# Patient Record
Sex: Female | Born: 1975 | Hispanic: No | Marital: Single | Smoking: Never smoker
Health system: Southern US, Community
[De-identification: ages and names within clinical notes are randomized; demographics above are authoritative.]

## PROBLEM LIST (undated history)

## (undated) DIAGNOSIS — F419 Anxiety disorder, unspecified: Secondary | ICD-10-CM

## (undated) DIAGNOSIS — K219 Gastro-esophageal reflux disease without esophagitis: Secondary | ICD-10-CM

## (undated) HISTORY — DX: Anxiety disorder, unspecified: F41.9

## (undated) HISTORY — DX: Gastro-esophageal reflux disease without esophagitis: K21.9

---

## 2003-03-05 ENCOUNTER — Other Ambulatory Visit: Admission: RE | Admit: 2003-03-05 | Discharge: 2003-03-05 | Payer: Self-pay | Admitting: Obstetrics and Gynecology

## 2004-05-07 ENCOUNTER — Other Ambulatory Visit: Admission: RE | Admit: 2004-05-07 | Discharge: 2004-05-07 | Payer: Self-pay | Admitting: Obstetrics and Gynecology

## 2005-06-24 ENCOUNTER — Other Ambulatory Visit: Admission: RE | Admit: 2005-06-24 | Discharge: 2005-06-24 | Payer: Self-pay | Admitting: Obstetrics and Gynecology

## 2013-03-21 ENCOUNTER — Encounter: Payer: Self-pay | Admitting: General Practice

## 2013-03-21 ENCOUNTER — Ambulatory Visit (INDEPENDENT_AMBULATORY_CARE_PROVIDER_SITE_OTHER): Payer: No Typology Code available for payment source | Admitting: General Practice

## 2013-03-21 ENCOUNTER — Ambulatory Visit (INDEPENDENT_AMBULATORY_CARE_PROVIDER_SITE_OTHER): Payer: No Typology Code available for payment source

## 2013-03-21 VITALS — BP 126/81 | HR 73 | Temp 97.8°F | Wt 190.5 lb

## 2013-03-21 DIAGNOSIS — M545 Low back pain: Secondary | ICD-10-CM

## 2013-03-21 DIAGNOSIS — M5430 Sciatica, unspecified side: Secondary | ICD-10-CM

## 2013-03-21 DIAGNOSIS — N926 Irregular menstruation, unspecified: Secondary | ICD-10-CM

## 2013-03-21 LAB — POCT URINALYSIS DIPSTICK
Bilirubin, UA: NEGATIVE
Blood, UA: NEGATIVE
Glucose, UA: NEGATIVE
Ketones, UA: NEGATIVE
Spec Grav, UA: 1.01
Urobilinogen, UA: NEGATIVE

## 2013-03-21 LAB — POCT UA - MICROSCOPIC ONLY: Mucus, UA: NEGATIVE

## 2013-03-21 LAB — POCT URINE PREGNANCY: Preg Test, Ur: NEGATIVE

## 2013-03-21 MED ORDER — IBUPROFEN 600 MG PO TABS: 600.0000 mg | ORAL_TABLET | Freq: Three times a day (TID) | ORAL | Status: DC | PRN

## 2013-03-21 MED ORDER — CYCLOBENZAPRINE HCL 5 MG PO TABS: 5.0000 mg | ORAL_TABLET | Freq: Two times a day (BID) | ORAL | Status: DC | PRN

## 2013-03-21 MED ORDER — METHYLPREDNISOLONE ACETATE 80 MG/ML IJ SUSP
80.0000 mg | Freq: Once | INTRAMUSCULAR | Status: AC
  Administered 2013-03-21: 80 mg via INTRAMUSCULAR

## 2013-03-21 NOTE — Progress Notes (Signed)
Subjective:    Patient ID: Diana Lopez, female    DOB: 07-Jun-1976, 37 y.o.   MRN: [id number]  Back Pain This is a new problem. The current episode started more than 1 month ago. The problem occurs 2 to 4 times per day. The problem has been gradually worsening since onset. The pain is present in the lumbar spine. The quality of the pain is described as aching. Radiates to: reports tingling in hands and thighs at times. The pain is at a severity of 7/10. The symptoms are aggravated by sitting. Associated symptoms include dysuria, numbness and tingling. Pertinent negatives include no abdominal pain, bladder incontinence, bowel incontinence, chest pain, fever, headaches, pelvic pain or weakness. She has tried NSAIDs for the symptoms.  Patient reports last menstrual cycle as about 1 month ago and her cycles are irregular. 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 Also reported moving items around in an apartment a few weeks ago.     Review of Systems  Constitutional: Negative for fever and chills.  HENT: Negative for neck pain and neck stiffness.   Respiratory: Negative for chest tightness, shortness of breath and wheezing.   Cardiovascular: Negative for chest pain and palpitations.  Gastrointestinal: Negative for abdominal pain and bowel incontinence.  Genitourinary: Positive for dysuria, urgency and frequency. Negative for bladder incontinence, hematuria, difficulty urinating and pelvic pain.  Musculoskeletal: Positive for back pain.  Neurological: Positive for tingling and numbness. Negative for dizziness, weakness and headaches.       Objective:   Physical Exam  Constitutional: She is oriented to person, place, and time. She appears well-developed and well-nourished.  Cardiovascular: Normal rate, regular rhythm and normal heart sounds.   Pulmonary/Chest: Effort normal and breath sounds normal. No respiratory distress. She exhibits no tenderness.  Abdominal: Soft. Bowel sounds are normal. She  exhibits no distension. There is no tenderness.  Musculoskeletal: She exhibits tenderness.  Tenderness noted to lumbar area with palpation, negative edema  Neurological: She is alert and oriented to person, place, and time.  Skin: Skin is warm and dry.  Psychiatric: She has a normal mood and affect.   [facility] reading (PRIMARY) by Coralie Keens, FNP-C, no fracture or dislocation noted.    Results for orders placed in visit on 03/21/13  POCT UA - MICROSCOPIC ONLY      Result Value Range   WBC, Ur, HPF, POC occ     RBC, urine, microscopic rare     Bacteria, U Microscopic moderate     Mucus, UA negative     Epithelial cells, urine per micros occ     Crystals, Ur, HPF, POC negative     Casts, Ur, LPF, POC negative     Yeast, UA negative    POCT URINALYSIS DIPSTICK      Result Value Range   Color, UA gold     Clarity, UA cloudy     Glucose, UA negative     Bilirubin, UA negative     Ketones, UA negative     Spec Grav, UA 1.010     Blood, UA negative     pH, UA 7.5     Protein, UA negative     Urobilinogen, UA negative     Nitrite, UA negative     Leukocytes, UA Negative    POCT URINE PREGNANCY      Result Value Range   Preg Test, Ur Negative  Assessment & Plan:  1. Low back pain - POCT UA - Microscopic Only - POCT urinalysis dipstick - POCT urine pregnancy - DG Lumbar Spine 2-3 Views; Future  2. Sciatica, unspecified laterality - methylPREDNISolone acetate (DEPO-MEDROL) injection 80 mg; Inject 1 mL (80 mg total) into the muscle once. - cyclobenzaprine (FLEXERIL) 5 MG tablet; Take 1 tablet (5 mg total) by mouth 2 (two) times daily as needed for muscle spasms.  Dispense: 30 tablet; Refill: 0 - ibuprofen (ADVIL,MOTRIN) 600 MG tablet; Take 1 tablet (600 mg total) by mouth every 8 (eight) hours as needed for pain.  Dispense: 30 tablet; Refill: 0 -warm compresses to affected area for 10-15 minutes, three times daily -RTO if  symptoms worsen or seek emergency medical treatment -will refer if no improvement -Patient verbalized understanding -Coralie Keens, FNP-C

## 2013-03-21 NOTE — Patient Instructions (Addendum)
Back Pain, Adult  Low back pain is very common. About 1 in 5 people have back pain. The cause of low back pain is rarely dangerous. The pain often gets better over time. About half of people with a sudden onset of back pain feel better in just 2 weeks. About 8 in 10 people feel better by 6 weeks.   CAUSES  Some common causes of back pain include:  · Strain of the muscles or ligaments supporting the spine.  · Wear and tear (degeneration) of the spinal discs.  · Arthritis.  · Direct injury to the back.  DIAGNOSIS  Most of the time, the direct cause of low back pain is not known. However, back pain can be treated effectively even when the exact cause of the pain is unknown. Answering your caregiver's questions about your overall health and symptoms is one of the most accurate ways to make sure the cause of your pain is not dangerous. If your caregiver needs more information, he or she may order lab work or imaging tests (X-rays or MRIs). However, even if imaging tests show changes in your back, this usually does not require surgery.  HOME CARE INSTRUCTIONS  For many people, back pain returns. Since low back pain is rarely dangerous, it is often a condition that people can learn to manage on their own.   · Remain active. It is stressful on the back to sit or stand in one place. Do not sit, drive, or stand in one place for more than 30 minutes at a time. Take short walks on level surfaces as soon as pain allows. Try to increase the length of time you walk each day.  · Do not stay in bed. Resting more than 1 or 2 days can delay your recovery.  · Do not avoid exercise or work. Your body is made to move. It is not dangerous to be active, even though your back may hurt. Your back will likely heal faster if you return to being active before your pain is gone.  · Pay attention to your body when you  bend and lift. Many people have less discomfort when lifting if they bend their knees, keep the load close to their bodies, and  avoid twisting. Often, the most comfortable positions are those that put less stress on your recovering back.  · Find a comfortable position to sleep. Use a firm mattress and lie on your side with your knees slightly bent. If you lie on your back, put a pillow under your knees.  · Only take over-the-counter or prescription medicines as directed by your caregiver. Over-the-counter medicines to reduce pain and inflammation are often the most helpful. Your caregiver may prescribe muscle relaxant drugs. These medicines help dull your pain so you can more quickly return to your normal activities and healthy exercise.  · Put ice on the injured area.  · Put ice in a plastic bag.  · Place a towel between your skin and the bag.  · Leave the ice on for 15-20 minutes, 3-4 times a day for the first 2 to 3 days. After that, ice and heat may be alternated to reduce pain and spasms.  · Ask your caregiver about trying back exercises and gentle massage. This may be of some benefit.  · Avoid feeling anxious or stressed. Stress increases muscle tension and can worsen back pain. It is important to recognize when you are anxious or stressed and learn ways to manage it. Exercise is a great option.  SEEK MEDICAL CARE IF:  · You have pain that is not relieved with rest or   medicine.  · You have pain that does not improve in 1 week.  · You have new symptoms.  · You are generally not feeling well.  SEEK IMMEDIATE MEDICAL CARE IF:   · You have pain that radiates from your back into your legs.  · You develop new bowel or bladder control problems.  · You have unusual weakness or numbness in your arms or legs.  · You develop nausea or vomiting.  · You develop abdominal pain.  · You feel faint.  Document Released: 08/10/2005 Document Revised: 02/09/2012 Document Reviewed: 12/29/2010  [employer]® Patient Information ©2014 [employer].

## 2013-03-30 ENCOUNTER — Encounter: Payer: Self-pay | Admitting: General Practice

## 2013-03-30 ENCOUNTER — Ambulatory Visit (INDEPENDENT_AMBULATORY_CARE_PROVIDER_SITE_OTHER): Payer: No Typology Code available for payment source | Admitting: General Practice

## 2013-03-30 DIAGNOSIS — M5430 Sciatica, unspecified side: Secondary | ICD-10-CM

## 2013-03-30 NOTE — Progress Notes (Signed)
  Subjective:    Patient ID: Diana Lopez, female    DOB: 1975/09/05, 37 y.o.   MRN: [id number]  HPI Patient presents today for follow up of low back pain. She reports the pain in back has decreased, but radiating to right thigh. She reports depo-medrol made her [place] and doesn't want any type of steriod. She reports feeling like she is in a daze when taking flexeril. She reports pain is sharp and radiates from low back to right thigh, rate as 5 on 1-10 scale.      Review of Systems  Constitutional: Negative for chills and fatigue.  Respiratory: Negative for chest tightness and shortness of breath.   Cardiovascular: Negative for chest pain and palpitations.  Musculoskeletal: Positive for back pain.       Periodic low back pain and left thigh  Neurological: Negative for dizziness, weakness and headaches.       Objective:   Physical Exam  Constitutional: She is oriented to person, place, and time. She appears well-developed and well-nourished.  Cardiovascular: Normal rate, regular rhythm and normal heart sounds.   Pulmonary/Chest: Effort normal and breath sounds normal. No respiratory distress. She exhibits no tenderness.  Musculoskeletal: She exhibits no tenderness.  Neurological: She is alert and oriented to person, place, and time.  Skin: Skin is warm and dry.  Psychiatric: She has a normal mood and affect.          Assessment & Plan:  1. Low back pain with sciatica, right - Ambulatory referral to Orthopedic Surgery -continue ibuprofen and flexeril -rest affected area -may apply heat or ice as directed -RTO if symptoms worsen or unresolved -Patient verbalized understanding -Coralie Keens, FNP-C

## 2014-10-18 ENCOUNTER — Encounter: Payer: Self-pay | Admitting: Family Medicine

## 2014-10-18 ENCOUNTER — Ambulatory Visit (INDEPENDENT_AMBULATORY_CARE_PROVIDER_SITE_OTHER): Payer: No Typology Code available for payment source

## 2014-10-18 ENCOUNTER — Ambulatory Visit (INDEPENDENT_AMBULATORY_CARE_PROVIDER_SITE_OTHER): Payer: No Typology Code available for payment source | Admitting: Family Medicine

## 2014-10-18 ENCOUNTER — Telehealth: Payer: Self-pay | Admitting: Nurse Practitioner

## 2014-10-18 VITALS — BP 122/70 | HR 69 | Temp 97.4°F | Ht 64.5 in | Wt 195.0 lb

## 2014-10-18 DIAGNOSIS — M25511 Pain in right shoulder: Secondary | ICD-10-CM | POA: Diagnosis not present

## 2014-10-18 DIAGNOSIS — R202 Paresthesia of skin: Secondary | ICD-10-CM | POA: Diagnosis not present

## 2014-10-18 DIAGNOSIS — R200 Anesthesia of skin: Secondary | ICD-10-CM

## 2014-10-18 DIAGNOSIS — G629 Polyneuropathy, unspecified: Secondary | ICD-10-CM

## 2014-10-18 MED ORDER — MELOXICAM 15 MG PO TABS: 15.0000 mg | ORAL_TABLET | Freq: Every day | ORAL | Status: DC

## 2014-10-18 NOTE — Telephone Encounter (Signed)
Appt given for today 

## 2014-10-18 NOTE — Patient Instructions (Signed)
Use warm wet compresses to the top of the shoulder and the back of the neck for 20 minutes 3 or 4 times daily Take anti-inflammatory medicine as directed and discontinue this if it bothers her stomach

## 2014-10-18 NOTE — Progress Notes (Signed)
Subjective:    Patient ID: Diana Lopez, female    DOB: Sep 06, 1975, 39 y.o.   MRN: [id number]  HPI Patient here today for neck, right shoulder and arm pain that also causes some right hand numbness.         There are no active problems to display for this patient.  Outpatient Encounter Prescriptions as of 10/18/2014  Medication Sig  . [DISCONTINUED] cyclobenzaprine (FLEXERIL) 5 MG tablet Take 1 tablet (5 mg total) by mouth 2 (two) times daily as needed for muscle spasms.  . [DISCONTINUED] ibuprofen (ADVIL,MOTRIN) 600 MG tablet Take 1 tablet (600 mg total) by mouth every 8 (eight) hours as needed for pain.    Review of Systems  Constitutional: Negative.   HENT: Negative.   Eyes: Negative.   Respiratory: Negative.   Cardiovascular: Negative.   Gastrointestinal: Negative.   Endocrine: Negative.   Genitourinary: Negative.   Musculoskeletal: Positive for arthralgias (right shoulder and arm) and neck pain.  Skin: Negative.   Allergic/Immunologic: Negative.   Neurological: Positive for numbness (of right hand at times).  Hematological: Negative.   Psychiatric/Behavioral: Negative.        Objective:   Physical Exam  Constitutional: She is oriented to person, place, and time. She appears well-developed and well-nourished. No distress.  HENT:  Head: Normocephalic and atraumatic.  Mouth/Throat: Oropharynx is clear and moist.  Eyes: Conjunctivae and EOM are normal. Pupils are equal, round, and reactive to light. Right eye exhibits no discharge. Left eye exhibits no discharge. No scleral icterus.  Neck: Normal range of motion. Neck supple. No thyromegaly present.  Cardiovascular: Normal rate, regular rhythm and normal heart sounds.   No murmur heard. Pulmonary/Chest: Effort normal and breath sounds normal. No respiratory distress. She has no wheezes. She has no rales. She exhibits no tenderness.  Abdominal: She exhibits no mass.  Musculoskeletal: Normal range of motion. She  exhibits tenderness.  Tenderness right before meals joint area and superior shoulder  Lymphadenopathy:    She has no cervical adenopathy.  Neurological: She is alert and oriented to person, place, and time. She has normal reflexes. No cranial nerve deficit.  Skin: Skin is warm and dry. No rash noted.  Psychiatric: She has a normal mood and affect. Her behavior is normal. Judgment and thought content normal.  Nursing note and vitals reviewed.  BP 122/70 mmHg  Pulse 69  Temp(Src) 97.4 F (36.3 C) (Oral)  Ht 5' 4.5" (1.638 m)  Wt 195 lb (88.451 kg)  BMI 32.97 kg/m2  LMP 10/11/2014  [facility] reading (PRIMARY) by  Dr. Parke SimmersMoore-C-spine--no obvious disease or injury                                      Assessment & Plan:  1. Right shoulder pain -Use warm wet compresses 20 minutes 3 or 4 times daily to posterior neck and superior right shoulder - DG Cervical Spine Complete; Future - meloxicam (MOBIC) 15 MG tablet; Take 1 tablet (15 mg total) by mouth daily.  Dispense: 30 tablet; Refill: 0  2. Numbness and tingling in right hand -Take anti-inflammatory medicines and use moist heat - DG Cervical Spine Complete; Future  3. Neuropathy -Take anti-inflammatory medicines and return to clinic for recheck in 3 weeks if neuropathy continues we may very well need to do an MRI.  Patient Instructions  Use warm wet compresses to the top of the shoulder and the  back of the neck for 20 minutes 3 or 4 times daily Take anti-inflammatory medicine as directed and discontinue this if it bothers her stomach   Nyra Capes MD

## 2014-10-19 ENCOUNTER — Other Ambulatory Visit: Payer: Self-pay | Admitting: Family Medicine

## 2014-10-19 DIAGNOSIS — G629 Polyneuropathy, unspecified: Secondary | ICD-10-CM

## 2014-10-31 ENCOUNTER — Ambulatory Visit (HOSPITAL_COMMUNITY)
Admission: RE | Admit: 2014-10-31 | Discharge: 2014-10-31 | Disposition: A | Discharge status: Home / Self Care | Payer: PRIVATE HEALTH INSURANCE | Source: Ambulatory Visit | Attending: Family Medicine | Admitting: Family Medicine

## 2014-10-31 DIAGNOSIS — R200 Anesthesia of skin: Secondary | ICD-10-CM | POA: Insufficient documentation

## 2014-10-31 DIAGNOSIS — R531 Weakness: Secondary | ICD-10-CM | POA: Insufficient documentation

## 2014-10-31 DIAGNOSIS — M5022 Other cervical disc displacement, mid-cervical region: Secondary | ICD-10-CM | POA: Insufficient documentation

## 2014-10-31 DIAGNOSIS — G629 Polyneuropathy, unspecified: Secondary | ICD-10-CM

## 2014-10-31 DIAGNOSIS — M542 Cervicalgia: Secondary | ICD-10-CM | POA: Diagnosis present

## 2014-11-06 ENCOUNTER — Ambulatory Visit (INDEPENDENT_AMBULATORY_CARE_PROVIDER_SITE_OTHER): Payer: No Typology Code available for payment source | Admitting: Family Medicine

## 2014-11-06 ENCOUNTER — Encounter: Payer: Self-pay | Admitting: Family Medicine

## 2014-11-06 ENCOUNTER — Ambulatory Visit (INDEPENDENT_AMBULATORY_CARE_PROVIDER_SITE_OTHER): Payer: No Typology Code available for payment source

## 2014-11-06 VITALS — BP 132/89 | HR 73 | Temp 97.8°F | Ht 64.0 in | Wt 195.0 lb

## 2014-11-06 DIAGNOSIS — M25511 Pain in right shoulder: Secondary | ICD-10-CM | POA: Diagnosis not present

## 2014-11-06 DIAGNOSIS — M5020 Other cervical disc displacement, unspecified cervical region: Secondary | ICD-10-CM

## 2014-11-06 DIAGNOSIS — M549 Dorsalgia, unspecified: Secondary | ICD-10-CM

## 2014-11-06 DIAGNOSIS — G629 Polyneuropathy, unspecified: Secondary | ICD-10-CM | POA: Diagnosis not present

## 2014-11-06 DIAGNOSIS — M542 Cervicalgia: Secondary | ICD-10-CM

## 2014-11-06 DIAGNOSIS — M5030 Other cervical disc degeneration, unspecified cervical region: Secondary | ICD-10-CM

## 2014-11-06 NOTE — Progress Notes (Signed)
Subjective:    Patient ID: Diana Lopez, female    DOB: November 10, 1975, 39 y.o.   MRN: [id number]  HPI Patient here today for 2 week follow up on right shoulder pain and hand numbness. The plain films at the time of the first visit showed bony encroachment on the right from C2 and 3 through C6. As a result of these initial findings and because the patient was in so much pain and we did arrange for her to get an MRI. The MRI showed a bulging disc at C5 and 6 more prominent to the right and mild encroachment upon the foramen on the right. This would according to the radiologist have some potential for affecting right C VI nerve root. The patient today says her pain is more down and really upper thoracic spine toward the right shoulder blade. We informed her that we did not x-ray the thoracic spine at the right visit we only did the cervical spine. The patient did not tolerate the anti-inflammatory medicine as it causes nausea.        There are no active problems to display for this patient.  Outpatient Encounter Prescriptions as of 11/06/2014  Medication Sig  . [DISCONTINUED] meloxicam (MOBIC) 15 MG tablet Take 1 tablet (15 mg total) by mouth daily.    Review of Systems  Constitutional: Negative.   HENT: Negative.   Eyes: Negative.   Respiratory: Negative.   Cardiovascular: Negative.   Gastrointestinal: Negative.   Endocrine: Negative.   Genitourinary: Negative.   Musculoskeletal: Positive for arthralgias (right shoulder pain).  Skin: Negative.   Allergic/Immunologic: Negative.   Neurological: Negative.   Hematological: Negative.   Psychiatric/Behavioral: Negative.        Objective:   Physical Exam  Constitutional: She is oriented to person, place, and time. She appears well-developed and well-nourished.  HENT:  Head: Normocephalic.  Neck: Normal range of motion.  Musculoskeletal: Normal range of motion. She exhibits tenderness. She exhibits no edema.  The patient continues  to have right shoulder and right parathoracic spine pain.  Neurological: She is alert and oriented to person, place, and time.  Skin: Skin is warm and dry. No rash noted.  Psychiatric: She has a normal mood and affect. Her behavior is normal. Judgment and thought content normal.  Nursing note and vitals reviewed.  BP 132/89 mmHg  Pulse 73  Temp(Src) 97.8 F (36.6 C) (Oral)  Ht 5\' 4"  (1.626 m)  Wt 195 lb (88.451 kg)  BMI 33.46 kg/m2  LMP 10/11/2014  A copy of the plain films and MRI films were given to the patient and the anatomy was explained to her so that she understands with the neuropathy was coming from. She however, today describes pain being lower and she described before. We will get additional thoracic spine films.  [facility] reading (PRIMARY) by  Dr. Loveta Dellis-thoracic spine--                                      Assessment & Plan:  1. Mid back pain - DG Thoracic Spine 2 View; Future - Ambulatory referral to Neurosurgery  2. Bulge of cervical disc with myelopathy - Ambulatory referral to Neurosurgery  3. Neck pain - Ambulatory referral to Neurosurgery  4. Right shoulder pain - Ambulatory referral to Neurosurgery  Patient Instructions  Use warm wet compresses to the neck and posterior shoulder and avoid heavy lifting We will arrange  for you to have an appointment with neurosurgery to further evaluate abnormal findings on the MRI of the C-spine   Nyra Capes MD

## 2014-11-06 NOTE — Patient Instructions (Signed)
Use warm wet compresses to the neck and posterior shoulder and avoid heavy lifting We will arrange for you to have an appointment with neurosurgery to further evaluate abnormal findings on the MRI of the C-spine

## 2014-12-10 ENCOUNTER — Telehealth: Payer: Self-pay | Admitting: Family Medicine

## 2014-12-11 NOTE — Telephone Encounter (Signed)
LMOM as per DPR of 09/2014 that x-rays up front ready for pickup

## 2015-08-25 HISTORY — PX: CARPAL TUNNEL RELEASE: SHX101

## 2015-10-01 ENCOUNTER — Ambulatory Visit (INDEPENDENT_AMBULATORY_CARE_PROVIDER_SITE_OTHER): Payer: No Typology Code available for payment source | Admitting: Family Medicine

## 2015-10-01 ENCOUNTER — Encounter: Payer: Self-pay | Admitting: Family Medicine

## 2015-10-01 VITALS — BP 134/85 | HR 82 | Temp 97.5°F | Ht 64.0 in | Wt 206.2 lb

## 2015-10-01 DIAGNOSIS — R202 Paresthesia of skin: Secondary | ICD-10-CM

## 2015-10-01 DIAGNOSIS — Z131 Encounter for screening for diabetes mellitus: Secondary | ICD-10-CM

## 2015-10-01 DIAGNOSIS — Z13220 Encounter for screening for lipoid disorders: Secondary | ICD-10-CM

## 2015-10-01 MED ORDER — PREDNISONE 20 MG PO TABS: ORAL_TABLET | ORAL | Status: DC

## 2015-10-01 NOTE — Progress Notes (Signed)
BP 134/85 mmHg  Pulse 82  Temp(Src) 97.5 F (36.4 C) (Oral)  Ht 5' 4"  (1.626 m)  Wt 206 lb 3.2 oz (93.532 kg)  BMI 35.38 kg/m2   Subjective:    Patient ID: Diana Lopez, female    DOB: October 07, 1975, 40 y.o.   MRN: [id number]  HPI: Diana Lopez is a 40 y.o. female presenting on 10/01/2015 for Bilateral hand pain, numbness, tingling & swelling   HPI Workmen's Comp. bilateral hand pain Patient is coming in for [employer]she works for [employer]. She says she has been having increasingly worsening tingling and numbness in both hands shooting up into her arms and neck over the past 2 years. She says the pain is gotten so bad that she is having trouble holding things and her grip strength is a lot worse in her left hand. 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 She also had an MRI recently which showed she had possible nerve impingement on the right side of her neck. The pain on both sides tense tissue all the way up into her arm and her shoulder and into her neck when she has it. She describes tingling and numbness throughout all 5 digits of the hand and not more on one side than the other.  Relevant past medical, surgical, family and social history reviewed and updated as indicated. Interim medical history since our last visit reviewed. Allergies and medications reviewed and updated.  Review of Systems  Constitutional: Negative for fever and chills.  HENT: Negative for congestion, ear discharge and ear pain.   Eyes: Negative for redness and visual disturbance.  Respiratory: Negative for chest tightness and shortness of breath.   Cardiovascular: Negative for chest pain and leg swelling.  Genitourinary: Negative for dysuria and difficulty urinating.  Musculoskeletal: Negative for back pain and gait problem.  Skin: Negative for rash.  Neurological: Positive for weakness and numbness. Negative for dizziness,  tremors, speech difficulty, light-headedness and headaches.  Psychiatric/Behavioral: Negative for behavioral problems and agitation.  All other systems reviewed and are negative.   Per HPI unless specifically indicated above     Medication List       This list is accurate as of: 10/01/15  4:28 PM.  Always use your most recent med list.               predniSONE 20 MG tablet  Commonly known as:  DELTASONE  2 po at same time daily for 5 days           Objective:    BP 134/85 mmHg  Pulse 82  Temp(Src) 97.5 F (36.4 C) (Oral)  Ht 5' 4"  (1.626 m)  Wt 206 lb 3.2 oz (93.532 kg)  BMI 35.38 kg/m2  Wt Readings from Last 3 Encounters:  10/01/15 206 lb 3.2 oz (93.532 kg)  11/06/14 195 lb (88.451 kg)  10/31/14 195 lb (88.451 kg)    Physical Exam  Constitutional: She is oriented to person, place, and time. She appears well-developed and well-nourished. No distress.  Eyes: Conjunctivae and EOM are normal. Pupils are equal, round, and reactive to light.  Neck: Neck supple. No thyromegaly present.  Cardiovascular: Normal rate, regular rhythm, normal heart sounds and intact distal pulses.   No murmur heard. Pulmonary/Chest: Effort normal and breath sounds normal. No respiratory distress. She has no wheezes.  Musculoskeletal: Normal range of motion. She exhibits no edema or tenderness.  Lymphadenopathy:    She has no cervical adenopathy.  Neurological: She is alert and oriented to person, place, and time. She has normal reflexes. She displays no tremor. No cranial nerve deficit or sensory deficit. She exhibits normal muscle tone. Coordination normal.  Patient describes numbness and weakness in all 5 fingers of both hands, no weakness noted on exam  Skin: Skin is warm and dry. No rash noted. She is not diaphoretic.  Psychiatric: She has a normal mood and affect. Her behavior is normal.  Nursing note and vitals reviewed.   Results for orders placed or performed in visit on 03/21/13    POCT UA - Microscopic Only  Result Value Ref Range   WBC, Ur, HPF, POC occ    RBC, urine, microscopic rare    Bacteria, U Microscopic moderate    Mucus, UA negative    Epithelial cells, urine per micros occ    Crystals, Ur, HPF, POC negative    Casts, Ur, LPF, POC negative    Yeast, UA negative   POCT urinalysis dipstick  Result Value Ref Range   Color, UA gold    Clarity, UA cloudy    Glucose, UA negative    Bilirubin, UA negative    Ketones, UA negative    Spec Grav, UA 1.010    Blood, UA negative    pH, UA 7.5    Protein, UA negative    Urobilinogen, UA negative    Nitrite, UA negative    Leukocytes, UA Negative   POCT urine pregnancy  Result Value Ref Range   Preg Test, Ur Negative       Assessment & Plan:   Problem List Items Addressed This Visit    None    Visit Diagnoses    Paresthesia of both hands    -  Primary    Patient says she is having tingling and numbness bilaterally, difficult to discern between neck lesion and carpal tunnel, will send for nerve testing    Relevant Medications    predniSONE (DELTASONE) 20 MG tablet    Other Relevant Orders    Ambulatory referral to Neurology    TSH    Screening for diabetes mellitus        Relevant Orders    CMP14+EGFR    Screening for lipid disorders        Relevant Orders    Lipid panel        Follow up plan: Return if symptoms worsen or fail to improve.  Counseling provided for all of the vaccine components Orders Placed This Encounter  Procedures  . Ambulatory referral to Neurology    Caryl Pina, MD [facility] 10/01/2015, 4:28 PM

## 2015-10-22 ENCOUNTER — Telehealth: Payer: Self-pay | Admitting: Family Medicine

## 2015-10-23 ENCOUNTER — Encounter: Payer: Self-pay | Admitting: Neurology

## 2015-10-23 ENCOUNTER — Ambulatory Visit (INDEPENDENT_AMBULATORY_CARE_PROVIDER_SITE_OTHER): Payer: PRIVATE HEALTH INSURANCE | Admitting: Neurology

## 2015-10-23 VITALS — BP 126/84 | HR 70 | Ht 64.0 in | Wt 201.0 lb

## 2015-10-23 DIAGNOSIS — G5603 Carpal tunnel syndrome, bilateral upper limbs: Secondary | ICD-10-CM

## 2015-10-23 DIAGNOSIS — R200 Anesthesia of skin: Secondary | ICD-10-CM

## 2015-10-23 DIAGNOSIS — R208 Other disturbances of skin sensation: Secondary | ICD-10-CM

## 2015-10-23 NOTE — Progress Notes (Signed)
Patient is here for NCS/EMG of the upper extremities.  See procedure note for details.

## 2015-10-23 NOTE — Procedures (Signed)
[facility]  [address] [place], [address]  [place], [place] [address] Tel: [phone] Fax:  [phone] Test Date:  10/23/2015  Patient: Diana Lopez DOB: 10/15/1975 Physician: Nita Sickle  Sex: Female Height:  Ref Phys: Arville Care, MD  ID#: [id number] Temp: 33.1C Technician:    Patient Complaints: This is a 40 year-old female referred for evaluation of bilateral arm pain and paresthesias.  NCV & EMG Findings: Extensive electrodiagnostic testing of the right upper extremity and additional studies of the left shows: 1. Bilateral median sensory responses show prolonged latency (R5.0, L4.1 ms) and the right amplitude is significantly reduced (R7.5 V).  Bilateral ulnar sensory responses are within normal limits.  2. Right median motor response shows markedly prolonged latency (R7.0 ms) with normal amplitude. 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 Bilateral ulnar motor responses are within normal limits. 3. Sparse chronic motor axon loss changes are isolated to the right abductor pollicis brevis muscle, without accompanied active denervation.  Impression: 1. Right median neuropathy at or distal to the wrist, consistent with the clinical diagnosis of carpal tunnel syndrome; severe in degree electrically. 2. Left median neuropathy at or distal to the wrist, consistent with the clinical diagnosis of carpal tunnel syndrome; mild in degree electrically. 3. Incidentally, there is a right Martin-Gruber anastomosis, a normal variant. 4. There is no evidence of a cervical radiculopathy affecting the upper extremities.   _____________________________ Nita Sickle, D.O.    Nerve Conduction Studies Anti Sensory Summary Table   Stim Site NR Peak (ms) Norm Peak (ms) P-T Amp (V) Norm P-T Amp  Left Median Anti  Sensory (2nd Digit)  Wrist    4.1 <3.4 33.2 >20  Right Median Anti Sensory (2nd Digit)  Wrist    5.0 <3.4 7.5 >20  Left Ulnar Anti Sensory (5th Digit)  Wrist    2.5 <3.1 62.5 >12  Right Ulnar Anti Sensory (5th Digit)  Wrist    2.4 <3.1 60.8 >12   Motor Summary Table   Stim Site NR Onset (ms) Norm Onset (ms) O-P Amp (mV) Norm O-P Amp Site1 Site2 Delta-0 (ms) Dist (cm) Vel (m/s) Norm Vel (m/s)  Left Median Motor (Abd Poll Brev)  Wrist    4.1 <3.9 13.0 >6 Elbow Wrist 4.6 26.0 57 >50  Elbow    8.7  12.9         Right Median Motor (Abd Poll Brev)  Wrist    7.0 <3.9 6.6 >6 Elbow Wrist 2.5 26.0 104 >50  Elbow    9.5  6.3  Ulnar-wrist crossover Elbow 5.0 0.0    Ulnar-wrist crossover    4.5  4.9         Left Ulnar Motor (Abd Dig Minimi)  Wrist    2.3 <3.1 10.7 >7 B Elbow Wrist 3.5 21.5 61 >50  B Elbow    5.8  10.1  A Elbow B Elbow 1.3 10.0 77 >50  A Elbow    7.1  10.1         Right Ulnar Motor (Abd Dig Minimi)  Wrist    2.1 <3.1 8.3 >7 B Elbow Wrist 3.4 22.0 65 >50  B Elbow    5.5  8.3  A Elbow B Elbow 1.6 10.0 63 >50  A Elbow    7.1  7.8  EMG   Side Muscle Ins Act Fibs Psw Fasc Number Recrt Dur Dur. Amp Amp. Poly Poly. Comment  Right 1stDorInt Nml Nml Nml Nml Nml Nml Nml Nml Nml Nml Nml Nml N/A  Right Abd Poll Brev Nml Nml Nml Nml 1- Rapid Few 1+ Few 1+ Nml Nml N/A  Right Ext Indicis Nml Nml Nml Nml Nml Nml Nml Nml Nml Nml Nml Nml N/A  Right PronatorTeres Nml Nml Nml Nml Nml Nml Nml Nml Nml Nml Nml Nml N/A  Right Biceps Nml Nml Nml Nml Nml Nml Nml Nml Nml Nml Nml Nml N/A  Right Triceps Nml Nml Nml Nml Nml Nml Nml Nml Nml Nml Nml Nml N/A  Right Deltoid Nml Nml Nml Nml Nml Nml Nml Nml Nml Nml Nml Nml N/A  Left 1stDorInt Nml Nml Nml Nml Nml Nml Nml Nml Nml Nml Nml Nml N/A  Left Abd Poll Brev Nml Nml Nml Nml Nml Nml Nml Nml Nml Nml Nml Nml N/A  Left PronatorTeres Nml Nml Nml Nml Nml Nml Nml Nml Nml Nml Nml Nml N/A  Left Biceps Nml Nml Nml Nml Nml Nml Nml Nml Nml Nml Nml Nml N/A    Left Triceps Nml Nml Nml Nml Nml Nml Nml Nml Nml Nml Nml Nml N/A  Left Deltoid Nml Nml Nml Nml Nml Nml Nml Nml Nml Nml Nml Nml N/A      Waveforms:

## 2015-10-29 ENCOUNTER — Telehealth: Payer: Self-pay | Admitting: Family Medicine

## 2015-10-29 DIAGNOSIS — G5603 Carpal tunnel syndrome, bilateral upper limbs: Secondary | ICD-10-CM

## 2015-10-29 NOTE — Telephone Encounter (Signed)
Can you look at nerve study test from 3/1 under procedure. Patient advised that I will send to provider for review.

## 2015-10-30 NOTE — Telephone Encounter (Signed)
Patient aware of results and referral was done for Orthopedic surgon.

## 2015-10-30 NOTE — Telephone Encounter (Signed)
The results from the nerve studies show that she has significant carpal tunnel syndrome in the right wrist and hand and mild carpal tunnel in the left wrist and hand. I do not remember if she has a referral to an orthopedic but if she does not we should go ahead and refer her. Diana CareJoshua Tripp Goins, MD [facility] 10/30/2015, 7:52 AM

## 2015-10-31 ENCOUNTER — Telehealth: Payer: Self-pay | Admitting: Family Medicine

## 2015-11-07 ENCOUNTER — Telehealth: Payer: Self-pay | Admitting: Family Medicine

## 2015-12-30 ENCOUNTER — Encounter (INDEPENDENT_AMBULATORY_CARE_PROVIDER_SITE_OTHER): Payer: Self-pay

## 2016-01-24 ENCOUNTER — Encounter: Payer: Self-pay | Admitting: Family

## 2016-01-24 ENCOUNTER — Ambulatory Visit (INDEPENDENT_AMBULATORY_CARE_PROVIDER_SITE_OTHER): Payer: No Typology Code available for payment source | Admitting: Family

## 2016-01-24 VITALS — BP 120/95 | HR 69 | Temp 97.1°F | Ht 64.0 in | Wt 214.0 lb

## 2016-01-24 DIAGNOSIS — L0100 Impetigo, unspecified: Secondary | ICD-10-CM | POA: Diagnosis not present

## 2016-01-24 DIAGNOSIS — J3489 Other specified disorders of nose and nasal sinuses: Secondary | ICD-10-CM | POA: Diagnosis not present

## 2016-01-24 MED ORDER — MUPIROCIN 2 % EX OINT: 1.0000 "application " | TOPICAL_OINTMENT | Freq: Two times a day (BID) | CUTANEOUS | Status: DC

## 2016-01-24 NOTE — Progress Notes (Signed)
   Subjective:    Patient ID: Diana Lopez, female    DOB: 06-03-1976, 40 y.o.   MRN: [id number]  Rash This is a recurrent problem. The current episode started yesterday. The problem is unchanged. The affected locations include the face. The rash is characterized by blistering and pain. She was exposed to nothing. Pertinent negatives include no congestion, cough, diarrhea, eye pain, fever, joint pain, shortness of breath, sore throat or vomiting. Past treatments include antibiotic cream. The treatment provided no relief.      Review of Systems  Constitutional: Negative for fever.  HENT: Negative for congestion and sore throat.   Eyes: Negative for pain.  Respiratory: Negative for cough and shortness of breath.   Gastrointestinal: Negative for vomiting and diarrhea.  Musculoskeletal: Negative for joint pain.  Skin: Positive for rash.  All other systems reviewed and are negative.      Objective:   Physical Exam  Constitutional: She is oriented to person, place, and time. She appears well-developed and well-nourished. No distress.  HENT:  Head: Normocephalic and atraumatic.  Right Ear: External ear normal.  Left Ear: External ear normal.  Nose: Mucosal edema present.  Mouth/Throat: Oropharynx is clear and moist.  Right nare with yellow crusted lesion with mild swelling  Cardiovascular: Normal rate, regular rhythm, normal heart sounds and intact distal pulses.   No murmur heard. Pulmonary/Chest: Effort normal and breath sounds normal. No respiratory distress. She has no wheezes.  Musculoskeletal: Normal range of motion. She exhibits no edema or tenderness.  Neurological: She is alert and oriented to person, place, and time.  Skin: Skin is warm and dry.  Psychiatric: She has a normal mood and affect. Her behavior is normal. Judgment and thought content normal.  Vitals reviewed.    BP 120/95 mmHg  Pulse 69  Temp(Src) 97.1 F (36.2 C) (Oral)  Ht 5\' 4"  (1.626 m)  Wt 214 lb  (97.07 kg)  BMI 36.72 kg/m2      Assessment & Plan:  1. Impetigo -Do not scratch -Good hand hygiene discussed -RTO prn - mupirocin ointment (BACTROBAN) 2 %; Place 1 application into the nose 2 (two) times daily.  Dispense: 22 g; Refill: 0  2. Infected lesion in nose - mupirocin ointment (BACTROBAN) 2 %; Place 1 application into the nose 2 (two) times daily.  Dispense: 22 g; Refill: 0 - Anaerobic and Aerobic Culture  Jannifer Rodneyhristy Kenyata Napier, FNP

## 2016-01-24 NOTE — Patient Instructions (Signed)
Impetigo, Adult  Impetigo is an infection of the skin. It commonly occurs in young children, but it can also occur in adults. The infection causes itchy blisters and sores that produce brownish-yellow fluid. As the fluid dries, it forms a thick, honey-colored crust. These skin changes usually occur on the face but can also affect other areas of the body. Impetigo usually goes away in 7-10 days with treatment.  CAUSES  Impetigo is caused by two types of bacteria. It may be caused by staphylococci or streptococci bacteria. These bacteria cause impetigo when they get under the surface of the skin. This often happens after some damage to the skin, such as damage from:  · Cuts, scrapes, or scratches.  · Insect bites, especially when you scratch the area of a bite.  · Chickenpox or other illnesses that cause open skin sores.  · Nail biting or chewing.  Impetigo is contagious and can spread easily from one person to another. This may occur through close skin contact or by sharing towels, clothing, or other items with a person who has the infection.  RISK FACTORS  Some things that can increase the risk of getting this infection include:  · Playing sports that include skin-to-skin contact with others.  · Having a skin condition with open sores.  · Having many skin cuts or scrapes.  · Living in an area that has high humidity levels.  · Having poor hygiene.  · Having high levels of staphylococci in your nose.  SIGNS AND SYMPTOMS  Impetigo usually starts out as small blisters, often on the face. The blisters then break open and turn into tiny sores (lesions) with a yellow crust. In some cases, the blisters cause itching or burning. With scratching, irritation, or lack of treatment, these small lesions may get larger. Scratching can also cause impetigo to spread to other parts of the body. The bacteria can get under the fingernails and spread when you touch another area of your skin.  Other possible symptoms include:  · Larger  blisters.  · Pus.  · Swollen lymph glands.  DIAGNOSIS  This condition is usually diagnosed during a physical exam. A skin sample or sample of fluid from a blister may be taken for lab tests that involve growing bacteria (culture test). This can help confirm the diagnosis or help determine the best treatment.  TREATMENT  Mild impetigo can be treated with prescription antibiotic cream. Oral antibiotic medicine may be used in more severe cases. Medicines for itching may also be used.  HOME CARE INSTRUCTIONS  · Take medicines only as directed by your health care provider.  · To help prevent impetigo from spreading to other body areas:    Keep your fingernails short and clean.    Do not scratch the blisters or sores.    Cover infected areas, if necessary, to keep from scratching.  · Gently wash the infected areas with antibiotic soap and water.  · Soak crusted areas in warm, soapy water using antibiotic soap.    Gently rub the areas to remove crusts. Do not scrub.  · Wash your hands often to avoid spreading this infection.  · Stay home until you have used an antibiotic cream for 48 hours (2 days) or an oral antibiotic medicine for 24 hours (1 day). You should only return to work and activities with other people if your skin shows significant improvement.  PREVENTION   To keep the infection from spreading:  · Stay home until you have used   an antibiotic cream for 48 hours or an oral antibiotic for 24 hours.  · Wash your hands often.  · Do not engage in skin-to-skin contact with other people while you have still have blisters.  · Do not share towels, washcloths, or bedding with others while you have the infection.  SEEK MEDICAL CARE IF:  · You develop more blisters or sores despite treatment.  · Other family members get sores.  · Your skin sores are not improving after 48 hours of treatment.  · You have a fever.  SEEK IMMEDIATE MEDICAL CARE IF:  · You see spreading redness or swelling of the skin around your sores.  · You  see red streaks coming from your sores.  · You develop a sore throat.     This information is not intended to replace advice given to you by your health care provider. Make sure you discuss any questions you have with your health care provider.     Document Released: 08/31/2014 Document Reviewed: 08/31/2014  Elsevier Interactive Patient Education ©2016 [employer] Inc[employer]

## 2016-01-28 LAB — ANAEROBIC AND AEROBIC CULTURE

## 2016-03-05 ENCOUNTER — Telehealth: Payer: Self-pay | Admitting: Family Medicine

## 2016-03-06 NOTE — Telephone Encounter (Signed)
We can write a letter that says: Carpal tunnel is a result of chronic repetitive motions which could be related to work-related tasks. Arville CareJoshua Loman Logan, MD [facility] 03/06/2016, 10:17 AM

## 2016-03-06 NOTE — Telephone Encounter (Signed)
Letter printed and mailed to pt as requested by the pt.

## 2016-03-06 NOTE — Telephone Encounter (Signed)
Pt needs a letter stating that her Carpel Tunnel could be work related. Please advise.

## 2016-11-12 ENCOUNTER — Ambulatory Visit (INDEPENDENT_AMBULATORY_CARE_PROVIDER_SITE_OTHER): Payer: No Typology Code available for payment source | Admitting: Family Medicine

## 2016-11-12 ENCOUNTER — Encounter: Payer: Self-pay | Admitting: Family Medicine

## 2016-11-12 VITALS — BP 136/78 | HR 62 | Temp 98.7°F | Ht 64.0 in | Wt 212.0 lb

## 2016-11-12 DIAGNOSIS — K2100 Gastro-esophageal reflux disease with esophagitis, without bleeding: Secondary | ICD-10-CM

## 2016-11-12 MED ORDER — RANITIDINE HCL 150 MG PO TABS: 150.0000 mg | ORAL_TABLET | Freq: Two times a day (BID) | ORAL | 2 refills | Status: DC

## 2016-11-12 NOTE — Progress Notes (Signed)
BP 136/78   Pulse 62   Temp 98.7 F (37.1 C) (Oral)   Ht 5\' 4"  (1.626 m)   Wt 212 lb (96.2 kg)   BMI 36.39 kg/m    Subjective:    Patient ID: Diana Lopez, female    DOB: 09-27-1975, 41 y.o.   MRN: [id number]  HPI: Diana Lopez is a 41 y.o. female presenting on 11/12/2016 for Excessive gas, constipation, feeling of fullness in chest af   HPI Excessive gas and bloating and belching Patient has been having excessive gas and belching and burping and bloating and feeling of fullness in her upper abdomen and chest that has been increasing over the past few weeks. She has been having some constipation as well but did have a good bowel movement yesterday. She denies any nausea or vomiting or blood in her stool. She does have some epigastric discomfort but does not describe it as pain. This does not radiate anywhere else. She has had issues with acid reflux previously and has been taking some calcium tablets for this but does not feel like they are working.  Relevant past medical, surgical, family and social history reviewed and updated as indicated. Interim medical history since our last visit reviewed. Allergies and medications reviewed and updated.  Review of Systems  Constitutional: Negative for chills and fever.  Respiratory: Negative for chest tightness and shortness of breath.   Cardiovascular: Negative for chest pain and leg swelling.  Gastrointestinal: Positive for abdominal pain and constipation. Negative for blood in stool, diarrhea, nausea and vomiting.  Musculoskeletal: Negative for back pain and gait problem.  Skin: Negative for rash.  Neurological: Negative for light-headedness and headaches.  Psychiatric/Behavioral: Negative for agitation and behavioral problems.  All other systems reviewed and are negative.   Per HPI unless specifically indicated above   Allergies as of 11/12/2016   No Known Allergies     Medication List       Accurate as of 11/12/16   4:57 PM. Always use your most recent med list.          ranitidine 150 MG tablet Commonly known as:  ZANTAC Take 1 tablet (150 mg total) by mouth 2 (two) times daily.          Objective:    BP 136/78   Pulse 62   Temp 98.7 F (37.1 C) (Oral)   Ht 5\' 4"  (1.626 m)   Wt 212 lb (96.2 kg)   BMI 36.39 kg/m   Wt Readings from Last 3 Encounters:  11/12/16 212 lb (96.2 kg)  01/24/16 214 lb (97.1 kg)  10/23/15 201 lb (91.2 kg)    Physical Exam  Constitutional: She is oriented to person, place, and time. She appears well-developed and well-nourished. No distress.  Eyes: Conjunctivae are normal.  Cardiovascular: Normal rate, regular rhythm, normal heart sounds and intact distal pulses.   No murmur heard. Pulmonary/Chest: Effort normal and breath sounds normal. No respiratory distress. She has no wheezes.  Abdominal: Soft. Bowel sounds are normal. She exhibits no distension. There is tenderness (Mild tenderness on exam) in the epigastric area. There is no rigidity, no rebound, no guarding and no CVA tenderness.  Musculoskeletal: Normal range of motion. She exhibits no edema or tenderness.  Neurological: She is alert and oriented to person, place, and time. Coordination normal.  Skin: Skin is warm and dry. No rash noted. She is not diaphoretic.  Psychiatric: She has a normal mood and affect. Her behavior is normal.  Nursing note and vitals reviewed.     Assessment & Plan:   Problem List Items Addressed This Visit    None    Visit Diagnoses    Gastroesophageal reflux disease with esophagitis    -  Primary   Relevant Medications   ranitidine (ZANTAC) 150 MG tablet       Follow up plan: Return if symptoms worsen or fail to improve.  Counseling provided for all of the vaccine components No orders of the defined types were placed in this encounter.   Arville Care, MD [facility] 11/12/2016, 4:57 PM

## 2017-03-29 LAB — HM PAP SMEAR: HM Pap smear: NEGATIVE

## 2017-03-29 LAB — HM MAMMOGRAPHY

## 2017-10-28 LAB — TSH: TSH: 5.79 (ref <=5.90)

## 2018-01-05 ENCOUNTER — Other Ambulatory Visit: Payer: Self-pay

## 2018-01-05 ENCOUNTER — Encounter: Payer: Self-pay | Admitting: Physician Assistant

## 2018-01-05 ENCOUNTER — Encounter: Payer: Self-pay | Admitting: Emergency Medicine

## 2018-01-05 ENCOUNTER — Ambulatory Visit: Payer: No Typology Code available for payment source | Admitting: Physician Assistant

## 2018-01-05 VITALS — BP 108/70 | HR 72 | Temp 98.5°F | Resp 16 | Ht 64.5 in | Wt 213.0 lb

## 2018-01-05 DIAGNOSIS — R5382 Chronic fatigue, unspecified: Secondary | ICD-10-CM | POA: Diagnosis not present

## 2018-01-05 DIAGNOSIS — F329 Major depressive disorder, single episode, unspecified: Secondary | ICD-10-CM | POA: Diagnosis not present

## 2018-01-05 DIAGNOSIS — R1906 Epigastric swelling, mass or lump: Secondary | ICD-10-CM

## 2018-01-05 DIAGNOSIS — F419 Anxiety disorder, unspecified: Secondary | ICD-10-CM | POA: Diagnosis not present

## 2018-01-05 DIAGNOSIS — F32A Depression, unspecified: Secondary | ICD-10-CM

## 2018-01-05 NOTE — Progress Notes (Signed)
Patient presents to clinic today to establish care.  Diet -- Endorses poor diet currently but is working on portion sizes and well-balanced meals.  Patient endorses long-standing history of anxiety. Notes this has an effect on appetite as she is a stress eater. Denies depressed mood but notes occasional feelings of anhedonia. Notes chronic fatigue that has been present for quite some time. Was told by her prior PCP that she had elevated TSH but her free thyroid hormone levels looked good. States she felt she never got any where with previous assessments. Notes occasional constipation and dry skin. Has + family history of thyroid disorder. Patient denies fevers, chills, sweats or unexplainable weight loss. Denies melena or hematochezia. Notes joint aches/muscle aches, worse in the morning. Denies major stiffness. Denies any noted joint swelling. Denies rash.   Patient notes a firm area in the epigastric region that is more prominent with leaning back. Notes sometimes that when she bends over for too long she feels winded. 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 Denies any SOB with walking or climbing stairs. Denies palpitations, LH or dizziness. States she discussed this area of firmness with her previous PCP that told her it was her "flap" and everyone has one. Body mass index is 36 kg/m.  Health Maintenance: Immunizations -- Patient is unsure of last Tetanus.  Mammogram -- Followed by GYN - Dr. Ouida Sills. Last 03/29/17 and normal per patient.  PAP -- Followed by GYN - Dr. Ouida Sills. Last 03/29/17 and normal per patient.   Past Medical History:  Diagnosis Date  . Anxiety   . GERD (gastroesophageal reflux disease)     Past Surgical History:  Procedure Laterality Date  . CESAREAN SECTION      Current Outpatient Medications on File Prior to Visit  Medication Sig Dispense Refill  . levonorgestrel-ethinyl estradiol (AVIANE,ALESSE,LESSINA) 0.1-20  MG-MCG tablet Take 1 tablet by mouth daily.    Marland Kitchen loratadine (CLARITIN) 10 MG tablet Take 10 mg by mouth daily.     No current facility-administered medications on file prior to visit.     No Known Allergies  Family History  Problem Relation Age of Onset  . Hypertension Mother   . Heart disease Mother     Social History   Socioeconomic History  . Marital status: Single    Spouse name: Not on file  . Number of children: Not on file  . Years of education: Not on file  . Highest education level: Not on file  Occupational History  . Not on file  Social Needs  . Financial resource strain: Not on file  . Food insecurity:    Worry: Not on file    Inability: Not on file  . Transportation needs:    Medical: Not on file    Non-medical: Not on file  Tobacco Use  . Smoking status: Never Smoker  . Smokeless tobacco: Never Used  Substance and Sexual Activity  . Alcohol use: No  . Drug use: No  . Sexual activity: Not Currently  Lifestyle  . Physical activity:    Days per week: Not on file    Minutes per session: Not on file  . Stress: Not on file  Relationships  . Social connections:    Talks on phone: Not on file    Gets together: Not on file    Attends religious service: Not on file    Active member of club or organization: Not on  file    Attends meetings of clubs or organizations: Not on file    Relationship status: Not on file  . Intimate partner violence:    Fear of current or ex partner: Not on file    Emotionally abused: Not on file    Physically abused: Not on file    Forced sexual activity: Not on file  Other Topics Concern  . Not on file  Social History Narrative  . Not on file   Review of Systems  Constitutional: Positive for malaise/fatigue. Negative for chills and fever.  Eyes: Negative for blurred vision and double vision.  Respiratory: Negative for cough and shortness of breath.   Cardiovascular: Negative for chest pain and palpitations.    Gastrointestinal: Positive for heartburn. Negative for abdominal pain, blood in stool, constipation, diarrhea, melena, nausea and vomiting.  Musculoskeletal: Positive for joint pain and myalgias. Negative for falls.  Skin: Negative for itching and rash.  Neurological: Negative for dizziness and headaches.  Psychiatric/Behavioral: Negative for depression, hallucinations, substance abuse and suicidal ideas. The patient is nervous/anxious.    BP 108/70   Pulse 72   Temp 98.5 F (36.9 C) (Oral)   Resp 16   Ht 5' 4.5" (1.638 m)   Wt 213 lb (96.6 kg)   SpO2 98%   BMI 36.00 kg/m   Physical Exam  Constitutional: She is oriented to person, place, and time. She appears well-developed and well-nourished.  HENT:  Head: Normocephalic.  Eyes: Pupils are equal, round, and reactive to light. Conjunctivae and EOM are normal.  Neck: Neck supple. No thyromegaly present.  Cardiovascular: Normal rate, regular rhythm, normal heart sounds and intact distal pulses.  Pulmonary/Chest: Effort normal and breath sounds normal. No stridor. No respiratory distress. She has no wheezes. She has no rales. She exhibits no tenderness.  Neurological: She is alert and oriented to person, place, and time. No cranial nerve deficit.  Psychiatric: Her behavior is normal. Judgment and thought content normal.  Vitals reviewed.  Assessment/Plan: 1. Anxiety and depression Question if stemming from thyroid abnormality. This needs reassessed giving prior borderline abnormal results. Discussed counseling and pharmacotherapy. She will give some thought to counseling but would prefer to avoid medications. Will check TSH and free T4. If TSH still elevated with normal T4, will add on anti-TPO antibodies.  - TSH - T4, free   2. Chronic fatigue Unclear etiology. Some concern for thyroid dyfunction or AI issue giving joint symptoms. Will check CBC, CMP, b12, Vitamin D, Thyroid panel. ESR. IF ESR elevated will add on additional  studies such as ANA, Hs-CRP. Discussed clean diet and keeping active. Turmeric for joint aches.  - CBC w/Diff - Comp Met (CMET) - TSH - T4, free - Sedimentation rate - B12 - Vitamin D (25 hydroxy)  3. 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 No abnormality noted. Reassurance given.    Leeanne Rio, PA-C

## 2018-01-05 NOTE — Patient Instructions (Signed)
Please go to the lab today for blood work.  I will call you with your results. We will alter treatment regimen(s) if indicated by your results.   Please start a daily Turmeric supplement which is very helpful for chronic joint pain and inflammation and is a natural supplement.   Please work on trying to work in exercise when possible.  Also please try to keep some healthy snacks in a cooler -- botlled yogurt drink, nuts, chesses, etc to snack on during the day to help you keep from bad snacking.    Preventing Unhealthy Weight Gain, Adult Staying at a healthy weight is important. When fat builds up in your body, you may become overweight or obese. These conditions put you at greater risk for developing certain health problems, such as heart disease, diabetes, sleeping problems, joint problems, and some cancers. Unhealthy weight gain is often the result of making unhealthy choices in what you eat. It is also a result of not getting enough exercise. You can make changes to your lifestyle to prevent obesity and stay as healthy as possible. What nutrition changes can be made? To maintain a healthy weight and prevent obesity:  Eat only as much as your body needs. To do this: ? Pay attention to signs that you are hungry or full. Stop eating as soon as you feel full. ? If you feel hungry, try drinking water first. Drink enough water so your urine is clear or pale yellow. ? Eat smaller portions. ? Look at serving sizes on food labels. Most foods contain more than one serving per container. ? Eat the recommended amount of calories for your gender and activity level. While most active people should eat around 2,000 calories per day, if you are trying to lose weight or are not very active, you main need to eat less calories. Talk to your health care provider or dietitian about how many calories you should eat each day.  Choose healthy foods, such as: ? Fruits and vegetables. Try to fill at least half of  your plate at each meal with fruits and vegetables. ? Whole grains, such as whole wheat bread, brown rice, and quinoa. ? Lean meats, such as chicken or fish. ? Other healthy proteins, such as beans, eggs, or tofu. ? Healthy fats, such as nuts, seeds, fatty fish, and olive oil. ? Low-fat or fat-free dairy.  Check food labels and avoid food and drinks that: ? Are high in calories. ? Have added sugar. ? Are high in sodium. ? Have saturated fats or trans fats.  Limit how much you eat of the following foods: ? Prepackaged meals. ? Fast food. ? Fried foods. ? Processed meat, such as bacon, sausage, and deli meats. ? Fatty cuts of red meat and poultry with skin.  Cook foods in healthier ways, such as by baking, broiling, or grilling.  When grocery shopping, try to shop around the outside of the store. This helps you buy mostly fresh foods and avoid canned and prepackaged foods.  What lifestyle changes can be made?  Exercise at least 30 minutes 5 or more days each week. Exercising includes brisk walking, yard work, biking, running, swimming, and team sports like basketball and soccer. Ask your health care provider which exercises are safe for you.  Do not use any products that contain nicotine or tobacco, such as cigarettes and e-cigarettes. If you need help quitting, ask your health care provider.  Limit alcohol intake to no more than 1 drink a day  for nonpregnant women and 2 drinks a day for men. One drink equals 12 oz of beer, 5 oz of wine, or 1 oz of hard liquor.  Try to get 7-9 hours of sleep each night. What other changes can be made?  Keep a food and activity journal to keep track of: ? What you ate and how many calories you had. Remember to count sauces, dressings, and side dishes. ? Whether you were active, and what exercises you did. ? Your calorie, weight, and activity goals.  Check your weight regularly. Track any changes. If you notice you have gained weight, make  changes to your diet or activity routine.  Avoid taking weight-loss medicines or supplements. Talk to your health care provider before starting any new medicine or supplement.  Talk to your health care provider before trying any new diet or exercise plan. Why are these changes important? Eating healthy, staying active, and having healthy habits not only help prevent obesity, they also:  Help you to manage stress and emotions.  Help you to connect with friends and family.  Improve your self-esteem.  Improve your sleep.  Prevent long-term health problems.  What can happen if changes are not made? Being obese or overweight can cause you to develop joint or bone problems, which can make it hard for you to stay active or do activities you enjoy. Being obese or overweight also puts stress on your heart and lungs and can lead to health problems like diabetes, heart disease, and some cancers. Where to find more information: Talk with your health care provider or a dietitian about healthy eating and healthy lifestyle choices. You may also find other information through these resources:  [place] Department of Agriculture MyPlate: https://ball-collins.biz/  American Heart Association: www.heart.org  Centers for Disease Control and Prevention: FootballExhibition.com.br  Summary  Staying at a healthy weight is important. It helps prevent certain diseases and health problems, such as heart disease, diabetes, joint problems, sleep disorders, and some cancers.  Being obese or overweight can cause you to develop joint or bone problems, which can make it hard for you to stay active or do activities you enjoy.  You can prevent unhealthy weight gain by eating a healthy diet, exercising regularly, not smoking, limiting alcohol, and getting enough sleep.  Talk with your health care provider or a dietitian for guidance about healthy eating and healthy lifestyle choices. This information is not intended to replace advice  given to you by your health care provider. Make sure you discuss any questions you have with your health care provider. Document Released: 08/11/2016 Document Revised: 09/16/2016 Document Reviewed: 09/16/2016 Elsevier Interactive Patient Education  [employer].

## 2018-01-07 ENCOUNTER — Other Ambulatory Visit: Payer: Self-pay

## 2018-01-07 DIAGNOSIS — R700 Elevated erythrocyte sedimentation rate: Secondary | ICD-10-CM

## 2018-01-10 ENCOUNTER — Other Ambulatory Visit: Payer: Self-pay

## 2018-01-10 DIAGNOSIS — R7989 Other specified abnormal findings of blood chemistry: Secondary | ICD-10-CM

## 2018-01-10 LAB — CBC WITH DIFFERENTIAL/PLATELET
BASOS ABS: 82 {cells}/uL (ref 0–200)
Basophils Relative: 0.6 %
EOS PCT: 2.3 %
Eosinophils Absolute: 313 cells/uL (ref 15–500)
HCT: 40 % (ref 35.0–45.0)
HEMOGLOBIN: 13.2 g/dL (ref 11.7–15.5)
Lymphs Abs: 2842 cells/uL (ref 850–3900)
MCH: 27.9 pg (ref 27.0–33.0)
MCHC: 33 g/dL (ref 32.0–36.0)
MCV: 84.6 fL (ref 80.0–100.0)
MPV: 11 fL (ref 7.5–12.5)
Monocytes Relative: 5.3 %
Neutro Abs: 9642 cells/uL — ABNORMAL HIGH (ref 1500–7800)
Neutrophils Relative %: 70.9 %
Platelets: 432 10*3/uL — ABNORMAL HIGH (ref 140–400)
RBC: 4.73 10*6/uL (ref 3.80–5.10)
RDW: 13.1 % (ref 11.0–15.0)
Total Lymphocyte: 20.9 %
WBC mixed population: 721 cells/uL (ref 200–950)
WBC: 13.6 10*3/uL — ABNORMAL HIGH (ref 3.8–10.8)

## 2018-01-10 LAB — COMPREHENSIVE METABOLIC PANEL
AG RATIO: 1.3 (calc) (ref 1.0–2.5)
ALBUMIN MSPROF: 4 g/dL (ref 3.6–5.1)
ALT: 13 U/L (ref 6–29)
AST: 18 U/L (ref 10–30)
Alkaline phosphatase (APISO): 108 U/L (ref 33–115)
BILIRUBIN TOTAL: 0.3 mg/dL (ref 0.2–1.2)
BUN: 13 mg/dL (ref 7–25)
CALCIUM: 9.2 mg/dL (ref 8.6–10.2)
CO2: 22 mmol/L (ref 20–32)
Chloride: 105 mmol/L (ref 98–110)
Creat: 0.85 mg/dL (ref 0.50–1.10)
Globulin: 3.1 g/dL (calc) (ref 1.9–3.7)
Glucose, Bld: 89 mg/dL (ref 65–99)
POTASSIUM: 4.8 mmol/L (ref 3.5–5.3)
SODIUM: 137 mmol/L (ref 135–146)
TOTAL PROTEIN: 7.1 g/dL (ref 6.1–8.1)

## 2018-01-10 LAB — HIGH SENSITIVITY CRP

## 2018-01-10 LAB — VITAMIN B12: Vitamin B-12: 302 pg/mL (ref 200–1100)

## 2018-01-10 LAB — ANTI-NUCLEAR AB-TITER (ANA TITER): ANA Titer 1: 1:80 {titer} — ABNORMAL HIGH

## 2018-01-10 LAB — TSH: TSH: 4.68 mIU/L — ABNORMAL HIGH

## 2018-01-10 LAB — TEST AUTHORIZATION

## 2018-01-10 LAB — SEDIMENTATION RATE: Sed Rate: 39 mm/h — ABNORMAL HIGH (ref 0–20)

## 2018-01-10 LAB — VITAMIN D 25 HYDROXY (VIT D DEFICIENCY, FRACTURES): Vit D, 25-Hydroxy: 55 ng/mL (ref 30–100)

## 2018-01-10 LAB — T4, FREE: Free T4: 0.9 ng/dL (ref 0.8–1.8)

## 2018-01-10 LAB — ANA: Anti Nuclear Antibody(ANA): POSITIVE — AB

## 2018-01-10 LAB — THYROID PEROXIDASE ANTIBODY: Thyroperoxidase Ab SerPl-aCnc: 284 IU/mL — ABNORMAL HIGH (ref <9)

## 2018-01-10 MED ORDER — LEVOTHYROXINE SODIUM 50 MCG PO TABS: 50.0000 ug | ORAL_TABLET | Freq: Every day | ORAL | 0 refills | Status: DC

## 2018-01-10 NOTE — Progress Notes (Unsigned)
Labs ordered for ESR, TSH, and Free T4 in 4-6 weeks.

## 2018-01-11 DIAGNOSIS — F419 Anxiety disorder, unspecified: Principal | ICD-10-CM

## 2018-01-11 DIAGNOSIS — R5382 Chronic fatigue, unspecified: Secondary | ICD-10-CM | POA: Insufficient documentation

## 2018-01-11 DIAGNOSIS — F329 Major depressive disorder, single episode, unspecified: Secondary | ICD-10-CM | POA: Insufficient documentation

## 2018-01-26 ENCOUNTER — Other Ambulatory Visit: Payer: Self-pay

## 2018-01-26 ENCOUNTER — Encounter: Payer: Self-pay | Admitting: Physician Assistant

## 2018-01-26 ENCOUNTER — Ambulatory Visit: Payer: No Typology Code available for payment source | Admitting: Physician Assistant

## 2018-01-26 ENCOUNTER — Other Ambulatory Visit: Payer: No Typology Code available for payment source

## 2018-01-26 VITALS — BP 118/82 | HR 67 | Temp 98.1°F | Resp 16 | Ht 65.0 in | Wt 214.0 lb

## 2018-01-26 DIAGNOSIS — F329 Major depressive disorder, single episode, unspecified: Secondary | ICD-10-CM | POA: Diagnosis not present

## 2018-01-26 DIAGNOSIS — R7989 Other specified abnormal findings of blood chemistry: Secondary | ICD-10-CM | POA: Diagnosis not present

## 2018-01-26 DIAGNOSIS — R700 Elevated erythrocyte sedimentation rate: Secondary | ICD-10-CM

## 2018-01-26 DIAGNOSIS — R9431 Abnormal electrocardiogram [ECG] [EKG]: Secondary | ICD-10-CM | POA: Diagnosis not present

## 2018-01-26 DIAGNOSIS — R5382 Chronic fatigue, unspecified: Secondary | ICD-10-CM | POA: Diagnosis not present

## 2018-01-26 DIAGNOSIS — F419 Anxiety disorder, unspecified: Secondary | ICD-10-CM

## 2018-01-26 DIAGNOSIS — M25512 Pain in left shoulder: Secondary | ICD-10-CM

## 2018-01-26 MED ORDER — ESCITALOPRAM OXALATE 10 MG PO TABS: 10.0000 mg | ORAL_TABLET | Freq: Every day | ORAL | 3 refills | Status: DC

## 2018-01-26 MED ORDER — ALPRAZOLAM 0.25 MG PO TABS: 0.2500 mg | ORAL_TABLET | Freq: Two times a day (BID) | ORAL | 0 refills | Status: DC | PRN

## 2018-01-26 NOTE — Progress Notes (Signed)
Patient presents to clinic today to follow-up on anxiety/depressed mood. 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 Is taking as directed and tolerating well. Denies any change in mood with medication thus far. Is noting anhedonia, depressed mood and anxiety. Denies panic attack. Notes significant stressors at work that are contributing to mood. Denies SI/HI. Would like to reassess potential of medication.    Past Medical History:  Diagnosis Date  . Anxiety   . GERD (gastroesophageal reflux disease)     Current Outpatient Medications on File Prior to Visit  Medication Sig Dispense Refill  . levonorgestrel-ethinyl estradiol (AVIANE,ALESSE,LESSINA) 0.1-20 MG-MCG tablet Take 1 tablet by mouth daily.    Marland Kitchen levothyroxine (SYNTHROID, LEVOTHROID) 50 MCG tablet Take 1 tablet (50 mcg total) by mouth daily. 30 tablet 0  . loratadine (CLARITIN) 10 MG tablet Take 10 mg by mouth daily.    . Turmeric 450 MG CAPS Take 1 tablet by mouth daily.     No current facility-administered medications on file prior to visit.     Allergies  Allergen Reactions  . Wellbutrin [Bupropion]     Panic, Anxiety, Jitteriness    Family History  Problem Relation Age of Onset  . Hypertension Mother   . Heart disease Mother     Social History   Socioeconomic History  . Marital status: Single    Spouse name: Not on file  . Number of children: Not on file  . Years of education: Not on file  . Highest education level: Not on file  Occupational History  . Not on file  Social Needs  . Financial resource strain: Not on file  . Food insecurity:    Worry: Not on file    Inability: Not on file  . Transportation needs:    Medical: Not on file    Non-medical: Not on file  Tobacco Use  . Smoking status: Never Smoker  . Smokeless tobacco: Never Used  Substance and Sexual Activity  . Alcohol use: No  . Drug use: No  . Sexual activity: Not Currently    Lifestyle  . Physical activity:    Days per week: Not on file    Minutes per session: Not on file  . Stress: Not on file  Relationships  . Social connections:    Talks on phone: Not on file    Gets together: Not on file    Attends religious service: Not on file    Active member of club or organization: Not on file    Attends meetings of clubs or organizations: Not on file    Relationship status: Not on file  Other Topics Concern  . Not on file  Social History Narrative  . Not on file   Review of Systems - See HPI.  All other ROS are negative.  BP 118/82   Pulse 67   Temp 98.1 F (36.7 C) (Oral)   Resp 16   Ht 5' 5"  (1.651 m)   Wt 214 lb (97.1 kg)   SpO2 98%   BMI 35.61 kg/m   Physical Exam  Constitutional: She is oriented to person, place, and time. She appears well-developed and well-nourished.  HENT:  Head: Normocephalic and atraumatic.  Eyes: Pupils are equal, round, and reactive to light. Conjunctivae are normal.  Neck: Neck supple. No thyromegaly present.  Cardiovascular: Normal rate, regular rhythm and normal heart sounds.  Pulmonary/Chest: Effort normal and breath sounds normal.  Neurological: She is alert  and oriented to person, place, and time.  Psychiatric: She has a normal mood and affect.  Vitals reviewed.   Recent Results (from the past 2160 hour(s))  CBC w/Diff     Status: Abnormal   Collection Time: 01/05/18 10:37 AM  Result Value Ref Range   WBC 13.6 (H) 3.8 - 10.8 Thousand/uL   RBC 4.73 3.80 - 5.10 Million/uL   Hemoglobin 13.2 11.7 - 15.5 g/dL   HCT 40.0 35.0 - 45.0 %   MCV 84.6 80.0 - 100.0 fL   MCH 27.9 27.0 - 33.0 pg   MCHC 33.0 32.0 - 36.0 g/dL   RDW 13.1 11.0 - 15.0 %   Platelets 432 (H) 140 - 400 Thousand/uL   MPV 11.0 7.5 - 12.5 fL   Neutro Abs 9,642 (H) 1,500 - 7,800 cells/uL   Lymphs Abs 2,842 850 - 3,900 cells/uL   WBC mixed population 721 200 - 950 cells/uL   Eosinophils Absolute 313 15 - 500 cells/uL   Basophils Absolute 82  0 - 200 cells/uL   Neutrophils Relative % 70.9 %   Total Lymphocyte 20.9 %   Monocytes Relative 5.3 %   Eosinophils Relative 2.3 %   Basophils Relative 0.6 %  Comp Met (CMET)     Status: None   Collection Time: 01/05/18 10:37 AM  Result Value Ref Range   Glucose, Bld 89 65 - 99 mg/dL    Comment: .            Fasting reference interval .    BUN 13 7 - 25 mg/dL   Creat 0.85 0.50 - 1.10 mg/dL   BUN/Creatinine Ratio NOT APPLICABLE 6 - 22 (calc)   Sodium 137 135 - 146 mmol/L   Potassium 4.8 3.5 - 5.3 mmol/L   Chloride 105 98 - 110 mmol/L   CO2 22 20 - 32 mmol/L   Calcium 9.2 8.6 - 10.2 mg/dL   Total Protein 7.1 6.1 - 8.1 g/dL   Albumin 4.0 3.6 - 5.1 g/dL   Globulin 3.1 1.9 - 3.7 g/dL (calc)   AG Ratio 1.3 1.0 - 2.5 (calc)   Total Bilirubin 0.3 0.2 - 1.2 mg/dL   Alkaline phosphatase (APISO) 108 33 - 115 U/L   AST 18 10 - 30 U/L   ALT 13 6 - 29 U/L  TSH     Status: Abnormal   Collection Time: 01/05/18 10:37 AM  Result Value Ref Range   TSH 4.68 (H) mIU/L    Comment:           Reference Range .           > or = 20 Years  0.40-4.50 .                Pregnancy Ranges           First trimester    0.26-2.66           Second trimester   0.55-2.73           Third trimester    0.43-2.91   T4, free     Status: None   Collection Time: 01/05/18 10:37 AM  Result Value Ref Range   Free T4 0.9 0.8 - 1.8 ng/dL  Sedimentation rate     Status: Abnormal   Collection Time: 01/05/18 10:37 AM  Result Value Ref Range   Sed Rate 39 (H) 0 - 20 mm/h  B12     Status: None   Collection Time: 01/05/18 10:37 AM  Result Value Ref Range   Vitamin B-12 302 200 - 1,100 pg/mL    Comment: . Please Note: Although the reference range for vitamin B12 is [phone] pg/mL, it has been reported that between 5 and 10% of patients with values between 200 and 400 pg/mL may experience neuropsychiatric and hematologic abnormalities due to occult B12 deficiency; less than 1% of patients with values above 400  pg/mL will have symptoms. .   Vitamin D (25 hydroxy)     Status: None   Collection Time: 01/05/18 10:37 AM  Result Value Ref Range   Vit D, 25-Hydroxy 55 30 - 100 ng/mL    Comment: Vitamin D Status         25-OH Vitamin D: . Deficiency:                    <20 ng/mL Insufficiency:             20 - 29 ng/mL Optimal:                 > or = 30 ng/mL . For 25-OH Vitamin D testing on patients on  D2-supplementation and patients for whom quantitation  of D2 and D3 fractions is required, the QuestAssureD(TM) 25-OH VIT D, (D2,D3), LC/MS/MS is recommended: order  code [id number] (patients >24yr). . For more information on this test, go to: http://education.quest[facility].com/faq/FAQ163 (This link is being provided for  informational/educational purposes only.)   High sensitivity CRP     Status: Abnormal   Collection Time: 01/05/18 10:37 AM  Result Value Ref Range   hs-CRP >10.0 (H) mg/L    Comment: Reference Range Optimal <1.0 Jellinger PS et al. EPeterson LombardPract.2017;23(Suppl 2):1-87. . For ages >>44Years: hs-CRP mg/L  Risk According to AHA/CDC Guidelines <1.0         Lower relative cardiovascular risk. 1.0-3.0      Average relative cardiovascular risk. 3.1-10.0     Higher relative cardiovascular risk.              Consider retesting in 1 to 2 weeks to              exclude a benign transient elevation              in the baseline CRP value secondary              to infection or inflammation. >10.0        Persistent elevation, upon retesting,              may be associated with infection and              inflammation. .   Thyroid peroxidase antibody     Status: Abnormal   Collection Time: 01/05/18 10:37 AM  Result Value Ref Range   Thyroperoxidase Ab SerPl-aCnc 284 (H) <9 IU/mL  ANA     Status: Abnormal   Collection Time: 01/05/18 10:37 AM  Result Value Ref Range   Anti Nuclear Antibody(ANA) POSITIVE (A) NEGATIVE    Comment: ANA IFA is a first line screen for detecting  the presence of up to approximately 150 autoantibodies in various autoimmune diseases. A positive ANA IFA result is suggestive of autoimmune disease and reflexes to titer and pattern. Further laboratory testing may be considered if clinically indicated. . Visit Physician FAQs for interpretation of all antibodies in the [facility], prevalence, and association with diseases at http://education.[facility].com/ fVVO/HYW737.   TEST AUTHORIZATION     Status: None  Collection Time: 01/05/18 10:37 AM  Result Value Ref Range   TEST NAME: HS CRP ANA SCREEN, IFA, W/REF    TEST CODE: [id number]    CLIENT CONTACT: MEGAN FAGGE    REPORT ALWAYS MESSAGE SIGNATURE      Comment: . The laboratory testing on this patient was verbally requested or confirmed by the ordering physician or his or her authorized representative after contact with an employee of [employer]. Federal regulations require that we maintain on file written authorization for all laboratory testing.  Accordingly we are asking that the ordering physician or his or her authorized representative sign a copy of this report and promptly return it to the client service representative. . . Signature:____________________________________________________ . Please fax this signed page to [phone] or return it via your [employer] courier.   Anti-nuclear ab-titer (ANA titer)     Status: Abnormal   Collection Time: 01/05/18 10:37 AM  Result Value Ref Range   ANA Pattern 1 SPECKLED (A)     Comment: Speckled pattern is associated with mixed connective tissue disease (MCTD), systemic lupus erythematosus (SLE), Sjogren's syndrome, dermatomyositis, and systemic sclerosis/polymyositis overlap.    ANA Titer 1 1:80 (H) titer    Comment: A low level ANA titer may be present in pre-clinical autoimmune diseases and normal individuals.                 Reference Range                 <1:40        Negative                  1:40-1:80    Low Antibody Level                 >1:80        Elevated Antibody Level .     Assessment/Plan: 1. Acute pain of left shoulder Patient mentioned at end of visit. No abnormal exam findings. No continued symptoms. Occurred the other day at work while at rest. Most likely msk but EKG obtained giving concerns. EKG revealed some t-wave abnormalities. Will set her up for stress test to be on the safe side.  - EKG 12-Lead  2. Anxiety and depression After discussion of medication options, we have elected on starting Lexapro at 10 mg. Xanax for PRN use of severe acute anxiety only. Will titrate dose of Lexapro according to tolerability and response.   3. Chronic fatigue Repeat CBC and CRP today.  - CBC - CRP High sensitivity  4. Elevated TSH Taking levothyroxine as directed. Will repeat TSH and Free t4 today. - TSH  5. Elevated sed rate Is due for repeat labs. If still elevated with + ANA, even mildly so, will send to Rheumatology for further assessment.  - Antinuclear Antib (ANA) - Sedimentation rate   Leeanne Rio, PA-C

## 2018-01-26 NOTE — Patient Instructions (Signed)
Please go to the lab today for blood work.  I will call you with your results. We will alter treatment regimen(s) if indicated by your results.   I am starting you on a prescription called Lexapro. Take daily as directed. I am giving you a small script of Xanax to use only for severe acute anxiety.  Follow-up with me in 3-4 weeks for reassessment.   I am setting you up for a stress test due to the borderline EKG.  If you notice any bad chest pain or SOB, please go to the ER.   Please continue the levothyroxine as directed.

## 2018-01-27 ENCOUNTER — Encounter: Payer: Self-pay | Admitting: Emergency Medicine

## 2018-01-28 LAB — CBC
HEMATOCRIT: 36.7 % (ref 35.0–45.0)
Hemoglobin: 12 g/dL (ref 11.7–15.5)
MCH: 27.3 pg (ref 27.0–33.0)
MCHC: 32.7 g/dL (ref 32.0–36.0)
MCV: 83.6 fL (ref 80.0–100.0)
MPV: 12 fL (ref 7.5–12.5)
PLATELETS: 371 10*3/uL (ref 140–400)
RBC: 4.39 10*6/uL (ref 3.80–5.10)
RDW: 13 % (ref 11.0–15.0)
WBC: 9.8 10*3/uL (ref 3.8–10.8)

## 2018-01-28 LAB — ANTI-NUCLEAR AB-TITER (ANA TITER): ANA Titer 1: 1:80 {titer} — ABNORMAL HIGH

## 2018-01-28 LAB — THYROID PEROXIDASE ANTIBODY: Thyroperoxidase Ab SerPl-aCnc: 248 IU/mL — ABNORMAL HIGH (ref <9)

## 2018-01-28 LAB — HIGH SENSITIVITY CRP: hs-CRP: >10 mg/L — ABNORMAL HIGH

## 2018-01-28 LAB — ANA: ANA: POSITIVE — AB

## 2018-01-28 LAB — SEDIMENTATION RATE: Sed Rate: 34 mm/h — ABNORMAL HIGH (ref 0–20)

## 2018-01-28 LAB — TSH: TSH: 4.13 mIU/L

## 2018-01-28 LAB — T4, FREE: Free T4: 1 ng/dL (ref 0.8–1.8)

## 2018-01-31 ENCOUNTER — Other Ambulatory Visit: Payer: Self-pay | Admitting: Physician Assistant

## 2018-01-31 DIAGNOSIS — R700 Elevated erythrocyte sedimentation rate: Secondary | ICD-10-CM | POA: Insufficient documentation

## 2018-01-31 DIAGNOSIS — R7989 Other specified abnormal findings of blood chemistry: Secondary | ICD-10-CM | POA: Insufficient documentation

## 2018-01-31 DIAGNOSIS — E0500 Thyrotoxicosis with diffuse goiter without thyrotoxic crisis or storm: Secondary | ICD-10-CM

## 2018-01-31 DIAGNOSIS — R768 Other specified abnormal immunological findings in serum: Secondary | ICD-10-CM

## 2018-01-31 DIAGNOSIS — M25512 Pain in left shoulder: Secondary | ICD-10-CM | POA: Insufficient documentation

## 2018-02-09 ENCOUNTER — Other Ambulatory Visit: Payer: Self-pay | Admitting: Physician Assistant

## 2018-02-14 ENCOUNTER — Encounter: Payer: Self-pay | Admitting: Physician Assistant

## 2018-02-14 ENCOUNTER — Other Ambulatory Visit: Payer: Self-pay

## 2018-02-14 ENCOUNTER — Ambulatory Visit: Payer: No Typology Code available for payment source | Admitting: Physician Assistant

## 2018-02-14 VITALS — BP 132/84 | HR 62 | Temp 98.1°F | Resp 16 | Ht 65.0 in | Wt 213.0 lb

## 2018-02-14 DIAGNOSIS — F329 Major depressive disorder, single episode, unspecified: Secondary | ICD-10-CM

## 2018-02-14 DIAGNOSIS — F419 Anxiety disorder, unspecified: Secondary | ICD-10-CM

## 2018-02-14 MED ORDER — ESCITALOPRAM OXALATE 20 MG PO TABS: 20.0000 mg | ORAL_TABLET | Freq: Every day | ORAL | 5 refills | Status: DC

## 2018-02-14 NOTE — Progress Notes (Signed)
Patient presents to clinic today for follow-up of anxiety after starting Lexapro 10 mg daily. Endorses taking medication as directed. Had a headache for a couple of days with use but no issue since then. Notes significant improvement in her anxiety levels. Notes mood stable overall. States she does feel that there is still a ways to go. Has not taken any of the Xanax. Denies SI/HI.   Past Medical History:  Diagnosis Date  . Anxiety   . GERD (gastroesophageal reflux disease)     Current Outpatient Medications on File Prior to Visit  Medication Sig Dispense Refill  . levonorgestrel-ethinyl estradiol (AVIANE,ALESSE,LESSINA) 0.1-20 MG-MCG tablet Take 1 tablet by mouth daily.    Marland Kitchen levothyroxine (SYNTHROID, LEVOTHROID) 50 MCG tablet TAKE 1 TABLET BY MOUTH ONCE DAILY 90 tablet 1  . loratadine (CLARITIN) 10 MG tablet Take 10 mg by mouth daily.    . Turmeric 450 MG CAPS Take 1 tablet by mouth daily.    Marland Kitchen ALPRAZolam (XANAX) 0.25 MG tablet Take 1 tablet (0.25 mg total) by mouth 2 (two) times daily as needed for anxiety. (Patient not taking: Reported on 02/14/2018) 10 tablet 0   No current facility-administered medications on file prior to visit.     Allergies  Allergen Reactions  . Wellbutrin [Bupropion]     Panic, Anxiety, Jitteriness    Family History  Problem Relation Age of Onset  . Hypertension Mother   . Heart disease Mother     Social History   Socioeconomic History  . Marital status: Single    Spouse name: Not on file  . Number of children: Not on file  . Years of education: Not on file  . Highest education level: Not on file  Occupational History  . Not on file  Social Needs  . Financial resource strain: Not on file  . Food insecurity:    Worry: Not on file    Inability: Not on file  . Transportation needs:    Medical: Not on file    Non-medical: Not on file  Tobacco Use  . Smoking status: Never Smoker  . Smokeless tobacco: Never Used  Substance and Sexual  Activity  . Alcohol use: No  . Drug use: No  . Sexual activity: Not Currently  Lifestyle  . Physical activity:    Days per week: Not on file    Minutes per session: Not on file  . Stress: Not on file  Relationships  . Social connections:    Talks on phone: Not on file    Gets together: Not on file    Attends religious service: Not on file    Active member of club or organization: Not on file    Attends meetings of clubs or organizations: Not on file    Relationship status: Not on file  Other Topics Concern  . Not on file  Social History Narrative  . Not on file    Review of Systems - See HPI.  All other ROS are negative.  BP 132/84   Pulse 62   Temp 98.1 F (36.7 C) (Oral)   Resp 16   Ht _0  (1.651 m)   Wt 213 lb (96.6 kg)   SpO2 98%   BMI 35.45 kg/m   Physical Exam  Constitutional: She appears well-developed and well-nourished.  HENT:  Head: Normocephalic and atraumatic.  Eyes: Conjunctivae are normal.  Cardiovascular: Normal rate, regular rhythm and normal heart sounds.  Pulmonary/Chest: Effort normal.  Psychiatric: She has a normal  mood and affect.  Vitals reviewed.   Recent Results (from the past 2160 hour(s))  CBC w/Diff     Status: Abnormal   Collection Time: 01/05/18 10:37 AM  Result Value Ref Range   WBC 13.6 (H) 3.8 - 10.8 Thousand/uL   RBC 4.73 3.80 - 5.10 Million/uL   Hemoglobin 13.2 11.7 - 15.5 g/dL   HCT 40.0 35.0 - 45.0 %   MCV 84.6 80.0 - 100.0 fL   MCH 27.9 27.0 - 33.0 pg   MCHC 33.0 32.0 - 36.0 g/dL   RDW 13.1 11.0 - 15.0 %   Platelets 432 (H) 140 - 400 Thousand/uL   MPV 11.0 7.5 - 12.5 fL   Neutro Abs 9,642 (H) 1,500 - 7,800 cells/uL   Lymphs Abs 2,842 850 - 3,900 cells/uL   WBC mixed population 721 200 - 950 cells/uL   Eosinophils Absolute 313 15 - 500 cells/uL   Basophils Absolute 82 0 - 200 cells/uL   Neutrophils Relative % 70.9 %   Total Lymphocyte 20.9 %   Monocytes Relative 5.3 %   Eosinophils Relative 2.3 %   Basophils  Relative 0.6 %  Comp Met (CMET)     Status: None   Collection Time: 01/05/18 10:37 AM  Result Value Ref Range   Glucose, Bld 89 65 - 99 mg/dL    Comment: .            Fasting reference interval .    BUN 13 7 - 25 mg/dL   Creat 0.85 0.50 - 1.10 mg/dL   BUN/Creatinine Ratio NOT APPLICABLE 6 - 22 (calc)   Sodium 137 135 - 146 mmol/L   Potassium 4.8 3.5 - 5.3 mmol/L   Chloride 105 98 - 110 mmol/L   CO2 22 20 - 32 mmol/L   Calcium 9.2 8.6 - 10.2 mg/dL   Total Protein 7.1 6.1 - 8.1 g/dL   Albumin 4.0 3.6 - 5.1 g/dL   Globulin 3.1 1.9 - 3.7 g/dL (calc)   AG Ratio 1.3 1.0 - 2.5 (calc)   Total Bilirubin 0.3 0.2 - 1.2 mg/dL   Alkaline phosphatase (APISO) 108 33 - 115 U/L   AST 18 10 - 30 U/L   ALT 13 6 - 29 U/L  TSH     Status: Abnormal   Collection Time: 01/05/18 10:37 AM  Result Value Ref Range   TSH 4.68 (H) mIU/L    Comment:           Reference Range .           > or = 20 Years  0.40-4.50 .                Pregnancy Ranges           First trimester    0.26-2.66           Second trimester   0.55-2.73           Third trimester    0.43-2.91   T4, free     Status: None   Collection Time: 01/05/18 10:37 AM  Result Value Ref Range   Free T4 0.9 0.8 - 1.8 ng/dL  Sedimentation rate     Status: Abnormal   Collection Time: 01/05/18 10:37 AM  Result Value Ref Range   Sed Rate 39 (H) 0 - 20 mm/h  B12     Status: None   Collection Time: 01/05/18 10:37 AM  Result Value Ref Range   Vitamin B-12 302 200 - 1,100  pg/mL    Comment: . Please Note: Although the reference range for vitamin B12 is [phone] pg/mL, it has been reported that between 5 and 10% of patients with values between 200 and 400 pg/mL may experience neuropsychiatric and hematologic abnormalities due to occult B12 deficiency; less than 1% of patients with values above 400 pg/mL will have symptoms. .   Vitamin D (25 hydroxy)     Status: None   Collection Time: 01/05/18 10:37 AM  Result Value Ref Range   Vit D,  25-Hydroxy 55 30 - 100 ng/mL    Comment: Vitamin D Status         25-OH Vitamin D: . Deficiency:                    <20 ng/mL Insufficiency:             20 - 29 ng/mL Optimal:                 > or = 30 ng/mL . For 25-OH Vitamin D testing on patients on  D2-supplementation and patients for whom quantitation  of D2 and D3 fractions is required, the QuestAssureD(TM) 25-OH VIT D, (D2,D3), LC/MS/MS is recommended: order  code 910 085 1713 (patients >69yr). . For more information on this test, go to: http://education.quest[facility].com/faq/FAQ163 (This link is being provided for  informational/educational purposes only.)   High sensitivity CRP     Status: Abnormal   Collection Time: 01/05/18 10:37 AM  Result Value Ref Range   hs-CRP >10.0 (H) mg/L    Comment: Reference Range Optimal <1.0 Jellinger PS et al. EPeterson LombardPract.2017;23(Suppl 2):1-87. . For ages >>33Years: hs-CRP mg/L  Risk According to AHA/CDC Guidelines <1.0         Lower relative cardiovascular risk. 1.0-3.0      Average relative cardiovascular risk. 3.1-10.0     Higher relative cardiovascular risk.              Consider retesting in 1 to 2 weeks to              exclude a benign transient elevation              in the baseline CRP value secondary              to infection or inflammation. >10.0        Persistent elevation, upon retesting,              may be associated with infection and              inflammation. .   Thyroid peroxidase antibody     Status: Abnormal   Collection Time: 01/05/18 10:37 AM  Result Value Ref Range   Thyroperoxidase Ab SerPl-aCnc 284 (H) <9 IU/mL  ANA     Status: Abnormal   Collection Time: 01/05/18 10:37 AM  Result Value Ref Range   Anti Nuclear Antibody(ANA) POSITIVE (A) NEGATIVE    Comment: ANA IFA is a first line screen for detecting the presence of up to approximately 150 autoantibodies in various autoimmune diseases. A positive ANA IFA result is suggestive of autoimmune disease and  reflexes to titer and pattern. Further laboratory testing may be considered if clinically indicated. . Visit Physician FAQs for interpretation of all antibodies in the [place], prevalence, and association with diseases at http://education.[facility].com/ fDSK/AJG811.   TEST AUTHORIZATION     Status: None   Collection Time: 01/05/18 10:37 AM  Result Value Ref Range  TEST NAME: HS CRP ANA SCREEN, IFA, W/REF    TEST CODE: [id number]    CLIENT CONTACT: MEGAN FAGGE    REPORT ALWAYS MESSAGE SIGNATURE      Comment: . The laboratory testing on this patient was verbally requested or confirmed by the ordering physician or his or her authorized representative after contact with an employee of [employer]. Federal regulations require that we maintain on file written authorization for all laboratory testing.  Accordingly we are asking that the ordering physician or his or her authorized representative sign a copy of this report and promptly return it to the client service representative. . . Signature:____________________________________________________ . Please fax this signed page to [phone] or return it via your [employer] courier.   Anti-nuclear ab-titer (ANA titer)     Status: Abnormal   Collection Time: 01/05/18 10:37 AM  Result Value Ref Range   ANA Pattern 1 SPECKLED (A)     Comment: Speckled pattern is associated with mixed connective tissue disease (MCTD), systemic lupus erythematosus (SLE), Sjogren's syndrome, dermatomyositis, and systemic sclerosis/polymyositis overlap.    ANA Titer 1 1:80 (H) titer    Comment: A low level ANA titer may be present in pre-clinical autoimmune diseases and normal individuals.                 Reference Range                 <1:40        Negative                 1:40-1:80    Low Antibody Level                 >1:80        Elevated Antibody Level .   Thyroid peroxidase antibody     Status: Abnormal    Collection Time: 01/26/18 11:18 AM  Result Value Ref Range   Thyroperoxidase Ab SerPl-aCnc 248 (H) <9 IU/mL  Antinuclear Antib (ANA)     Status: Abnormal   Collection Time: 01/26/18 11:18 AM  Result Value Ref Range   Anti Nuclear Antibody(ANA) POSITIVE (A) NEGATIVE    Comment: ANA IFA is a first line screen for detecting the presence of up to approximately 150 autoantibodies in various autoimmune diseases. A positive ANA IFA result is suggestive of autoimmune disease and reflexes to titer and pattern. Further laboratory testing may be considered if clinically indicated. . Visit Physician FAQs for interpretation of all antibodies in the [place], prevalence, and association with diseases at http://education.[facility].com/ JSH/FWY637 .   T4, free     Status: None   Collection Time: 01/26/18 11:18 AM  Result Value Ref Range   Free T4 1.0 0.8 - 1.8 ng/dL  Sedimentation rate     Status: Abnormal   Collection Time: 01/26/18 11:18 AM  Result Value Ref Range   Sed Rate 34 (H) 0 - 20 mm/h  TSH     Status: None   Collection Time: 01/26/18 11:18 AM  Result Value Ref Range   TSH 4.13 mIU/L    Comment:           Reference Range .           > or = 20 Years  0.40-4.50 .                Pregnancy Ranges           First trimester    0.26-2.66  Second trimester   0.55-2.73           Third trimester    0.43-2.91   CBC     Status: None   Collection Time: 01/26/18 11:18 AM  Result Value Ref Range   WBC 9.8 3.8 - 10.8 Thousand/uL   RBC 4.39 3.80 - 5.10 Million/uL   Hemoglobin 12.0 11.7 - 15.5 g/dL   HCT 36.7 35.0 - 45.0 %   MCV 83.6 80.0 - 100.0 fL   MCH 27.3 27.0 - 33.0 pg   MCHC 32.7 32.0 - 36.0 g/dL   RDW 13.0 11.0 - 15.0 %   Platelets 371 140 - 400 Thousand/uL   MPV 12.0 7.5 - 12.5 fL  CRP High sensitivity     Status: Abnormal   Collection Time: 01/26/18 11:18 AM  Result Value Ref Range   hs-CRP >10.0 (H) mg/L    Comment: Reference Range Optimal <1.0 Jellinger  PS et al. [facility] Pract.2017;23(Suppl 2):1-87. . For ages >36 Years: hs-CRP mg/L  Risk According to AHA/CDC Guidelines <1.0         Lower relative cardiovascular risk. 1.0-3.0      Average relative cardiovascular risk. 3.1-10.0     Higher relative cardiovascular risk.              Consider retesting in 1 to 2 weeks to              exclude a benign transient elevation              in the baseline CRP value secondary              to infection or inflammation. >10.0        Persistent elevation, upon retesting,              may be associated with infection and              inflammation. Veto Kemps ab-titer (ANA titer)     Status: Abnormal   Collection Time: 01/26/18 11:18 AM  Result Value Ref Range   ANA Pattern 1 HOMOGENEOUS (A)     Comment: Homogeneous pattern is associated with systemic lupus erythematosus (SLE), drug induced lupus and juvenile idiopathic arthritis.    ANA Titer 1 1:80 (H) titer    Comment: A low level ANA titer may be present in pre-clinical autoimmune diseases and normal individuals.                 Reference Range                 <1:40        Negative                 1:40-1:80    Low Antibody Level                 >1:80        Elevated Antibody Level .     Assessment/Plan: Anxiety and depression Improved with Lexapro. Still with breakthrough symptoms. Will increase Lexapro to 20 mg daily. Follow-up 3 months.     Leeanne Rio, PA-C

## 2018-02-14 NOTE — Assessment & Plan Note (Signed)
Improved with Lexapro. Still with breakthrough symptoms. Will increase Lexapro to 20 mg daily. Follow-up 3 months.

## 2018-02-14 NOTE — Patient Instructions (Signed)
Please increase Lexapro to 20 mg daily. I have sent in a new prescription for you.  Ok to take 2 of the 10 mg tablets daily until you run out.  Follow-up with specialists as scheduled. Return in 1 month for lab only appointment to recheck thyroid levels. Follow-up with me in office in 3 months.

## 2018-02-23 NOTE — Progress Notes (Signed)
Office Visit Note  Patient: Diana Lopez             Date of Birth: [birth year]           MRN: [id number]             PCP: Noel Journey Referring: Waldon Merl, PA-C Visit Date: 03/07/2018 Occupation: Paramedic    Subjective:  Positive ANA.   History of Present Illness: Diana Lopez is a 42 y.o. female seen in consultation per request of her PCP for evaluation of positive ANA.  According to patient she has had some lower back pain for a while and it usually exacerbates when she lifts something heavy or bends over.  She states she was recently seen by her PCP and had x-rays of her lumbar spine.  She was told that she had a pinched nerve.  She is also had some discomfort in her right shoulder in the past which resolved.  She had right carpal tunnel release about 2 years ago which helped as well.  She has been experiencing some discomfort in her left shoulder like she had before with the right carpal tunnel syndrome.  She states she recently had nerve conduction velocity on her left hand.  She has been also experiencing some numbness in her left hand.  None of the other joints are painful.  She had some labs done by her PCP which came positive for thyroperoxidase antibodies and positive ANA.  She was referred here for further evaluation.  He denies any history of joint swelling.  Activities of Daily Living:  Patient reports morning stiffness for 0 minute.   Patient Reports nocturnal pain.  Bilateral hands Difficulty dressing/grooming: Denies Difficulty climbing stairs: Denies Difficulty getting out of chair: Denies Difficulty using hands for taps, buttons, cutlery, and/or writing: Denies   Review of Systems  Constitutional: Positive for fatigue. Negative for night sweats, weight gain and weight loss.  HENT: Positive for mouth dryness. Negative for mouth sores, trouble swallowing, trouble swallowing and nose dryness.   Eyes: Negative for pain, redness, visual  disturbance and dryness.  Respiratory: Positive for shortness of breath. Negative for cough and difficulty breathing.        Patient had a stress test recently  Cardiovascular: Negative for chest pain, palpitations, hypertension, irregular heartbeat and swelling in legs/feet.  Gastrointestinal: Negative for blood in stool, constipation and diarrhea.  Endocrine: Negative for increased urination.  Genitourinary: Negative for vaginal dryness.  Musculoskeletal: Positive for arthralgias and joint pain. Negative for joint swelling, myalgias, muscle weakness, morning stiffness, muscle tenderness and myalgias.  Skin: Negative for color change, rash, hair loss, skin tightness, ulcers and sensitivity to sunlight.  Allergic/Immunologic: Negative for susceptible to infections.  Neurological: Negative for dizziness, memory loss, night sweats and weakness.  Hematological: Negative for swollen glands.  Psychiatric/Behavioral: Positive for depressed mood. Negative for sleep disturbance. The patient is not nervous/anxious.     PMFS History:  Patient Active Problem List   Diagnosis Date Noted  . History of gastroesophageal reflux (GERD) 03/07/2018  . Elevated TSH 01/31/2018  . Elevated sed rate 01/31/2018  . Anxiety and depression 01/11/2018  . Chronic fatigue 01/11/2018    Past Medical History:  Diagnosis Date  . Anxiety   . GERD (gastroesophageal reflux disease)     Family History  Problem Relation Age of Onset  . Hypertension Mother   . Heart disease Mother   . Heart disease Father   . Hypertension Father   .  Diabetes Father   . Diabetes Sister   . Diabetes Sister   . Healthy Daughter    Past Surgical History:  Procedure Laterality Date  . CARPAL TUNNEL RELEASE Right 2017  . CESAREAN SECTION     Social History   Social History Narrative  . Not on file     Objective: Vital Signs: BP (!) 158/88 (BP Location: Right Arm, Patient Position: Sitting, Cuff Size: Normal)   Pulse (!) 58    Resp 16   Ht 5\' 4"  (1.626 m)   Wt 218 lb (98.9 kg)   BMI 37.42 kg/m    Physical Exam  Constitutional: She is oriented to person, place, and time. She appears well-developed and well-nourished.  HENT:  Head: Normocephalic and atraumatic.  Eyes: Conjunctivae and EOM are normal.  Neck: Normal range of motion.  Cardiovascular: Normal rate, regular rhythm, normal heart sounds and intact distal pulses.  Pulmonary/Chest: Effort normal and breath sounds normal.  Abdominal: Soft. Bowel sounds are normal.  Lymphadenopathy:    She has no cervical adenopathy.  Neurological: She is alert and oriented to person, place, and time.  Skin: Skin is warm and dry. Capillary refill takes less than 2 seconds.  Psychiatric: She has a normal mood and affect. Her behavior is normal.  Nursing note and vitals reviewed.    Musculoskeletal Exam: C-spine thoracic lumbar spine good range of motion.  Shoulder joints elbow joints wrist joint MCPs PIPs DIPs been good range of motion.  She has some thickening of PIP DIP joints with no synovitis.  Hip joints knee joints ankles MTPs PIPs were in good range of motion.  She is some crepitus in her right knee joint without any warmth swelling or effusion.  CDAI Exam: No CDAI exam completed.    Investigation: Findings:  01/26/2018: Thyroperoxidase antibody 248, ANA 1: 80 homogeneous, T4 free 1.0, sed rate 34, TSH 4.13, CRP 10, Vitamin B12 302, Vitamin D 55   Component     Latest Ref Rng & Units 01/26/2018  Thyroperoxidase Ab SerPl-aCnc     <9 IU/mL 248 (H)  Anti Nuclear Antibody(ANA)     NEGATIVE POSITIVE (A)  T4,Free(Direct)     0.8 - 1.8 ng/dL 1.0  Sed Rate     0 - 20 mm/h 34 (H)  TSH     mIU/L 4.13   Component     Latest Ref Rng & Units 01/26/2018  ANA Pattern 1      HOMOGENEOUS (A)  ANA Titer 1     titer 1:80 (H)  hs-CRP     mg/L >10.0 (H)   Component     Latest Ref Rng & Units 01/05/2018  Vitamin B12     200 - 1,100 pg/mL 302  Vitamin D,  25-Hydroxy     30 - 100 ng/mL 55   CBC Latest Ref Rng & Units 01/26/2018 01/05/2018  WBC 3.8 - 10.8 Thousand/uL 9.8 13.6(H)  Hemoglobin 11.7 - 15.5 g/dL 16.1 09.6  Hematocrit 04.5 - 45.0 % 36.7 40.0  Platelets 140 - 400 Thousand/uL 371 432(H)   CMP Latest Ref Rng & Units 01/05/2018  Glucose 65 - 99 mg/dL 89  BUN 7 - 25 mg/dL 13  Creatinine 4.09 - 8.11 mg/dL 9.14  Sodium 782 - 956 mmol/L 137  Potassium 3.5 - 5.3 mmol/L 4.8  Chloride 98 - 110 mmol/L 105  CO2 20 - 32 mmol/L 22  Calcium 8.6 - 10.2 mg/dL 9.2  Total Protein 6.1 - 8.1 g/dL 7.1  Total Bilirubin 0.2 - 1.2 mg/dL 0.3  AST 10 - 30 U/L 18  ALT 6 - 29 U/L 13     Imaging: Xr Knee 3 View Right  Result Date: 03/07/2018 Mild medial compartment narrowing with the medial osteophyte was noted.  No chondrocalcinosis was noted.  Moderate patellofemoral narrowing was noted. 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   Speciality Comments: No specialty comments available.    Procedures:  No procedures performed Allergies: Wellbutrin [bupropion]   Assessment / Plan:     Visit Diagnoses: Positive ANA (antinuclear antibody) -patient complains of polyarthralgia.  She does not have any synovitis on examination.  The pain is mostly been in her shoulders and her right knee joint.  She had good range of motion on examination and no synovitis on exam today.  She has no other clinical features of autoimmune disease.  She is positive ANA which could be related to anti-thyroid antibodies.  I will obtain AVISE labs today.  01/26/2018: Thyroperoxidase antibody 248, ANA 1: 80 homogeneous, T4 free 1.0, sed rate 34, TSH 4.13, CRP 10, Vitamin B12 302, Vitamin D 55  Chronic fatigue-she complains of fatigue for many years.  She does have intermittent insomnia.  She is also gained weight.  Chronic pain of right knee - Plan: XR KNEE 3 VIEW RIGHT.  Weight loss diet and exercise was discussed.  The  knee joint x-ray showed mild osteoarthritis and moderate patellofemoral narrowing.  Handout on knee joint exercises was given.  Weight loss will be helpful.  Left hand paresthesia-patient reports that she had nerve conduction velocities.  Status post carpal tunnel release - Right  Anxiety and depression  History of gastroesophageal reflux (GERD)    Orders: Orders Placed This Encounter  Procedures  . XR KNEE 3 VIEW RIGHT   No orders of the defined types were placed in this encounter.   Face-to-face time spent with patient was 30 minutes. Greater than 50% of time was spent in counseling and coordination of care.  Follow-Up Instructions: Return for +ANA.   Pollyann SavoyShaili Bryant Lipps, MD  Note - This record has been created using Animal nutritionistDragon software.  Chart creation errors have been sought, but may not always  have been located. Such creation errors do not reflect on  the standard of medical care.

## 2018-03-04 ENCOUNTER — Other Ambulatory Visit: Payer: Self-pay

## 2018-03-04 ENCOUNTER — Ambulatory Visit (INDEPENDENT_AMBULATORY_CARE_PROVIDER_SITE_OTHER): Payer: No Typology Code available for payment source

## 2018-03-04 DIAGNOSIS — R9439 Abnormal result of other cardiovascular function study: Secondary | ICD-10-CM

## 2018-03-04 DIAGNOSIS — R9431 Abnormal electrocardiogram [ECG] [EKG]: Secondary | ICD-10-CM | POA: Diagnosis not present

## 2018-03-07 ENCOUNTER — Ambulatory Visit (INDEPENDENT_AMBULATORY_CARE_PROVIDER_SITE_OTHER): Payer: No Typology Code available for payment source | Admitting: Rheumatology

## 2018-03-07 ENCOUNTER — Ambulatory Visit (INDEPENDENT_AMBULATORY_CARE_PROVIDER_SITE_OTHER): Payer: Self-pay

## 2018-03-07 ENCOUNTER — Encounter: Payer: Self-pay | Admitting: Rheumatology

## 2018-03-07 VITALS — BP 158/88 | HR 58 | Resp 16 | Ht 64.0 in | Wt 218.0 lb

## 2018-03-07 DIAGNOSIS — F329 Major depressive disorder, single episode, unspecified: Secondary | ICD-10-CM

## 2018-03-07 DIAGNOSIS — M25561 Pain in right knee: Secondary | ICD-10-CM

## 2018-03-07 DIAGNOSIS — R768 Other specified abnormal immunological findings in serum: Secondary | ICD-10-CM | POA: Diagnosis not present

## 2018-03-07 DIAGNOSIS — Z8719 Personal history of other diseases of the digestive system: Secondary | ICD-10-CM

## 2018-03-07 DIAGNOSIS — R5382 Chronic fatigue, unspecified: Secondary | ICD-10-CM

## 2018-03-07 DIAGNOSIS — G8929 Other chronic pain: Secondary | ICD-10-CM

## 2018-03-07 DIAGNOSIS — F419 Anxiety disorder, unspecified: Secondary | ICD-10-CM

## 2018-03-07 DIAGNOSIS — Z9889 Other specified postprocedural states: Secondary | ICD-10-CM

## 2018-03-07 DIAGNOSIS — R202 Paresthesia of skin: Secondary | ICD-10-CM

## 2018-03-07 NOTE — Patient Instructions (Signed)

## 2018-03-09 ENCOUNTER — Telehealth (HOSPITAL_COMMUNITY): Payer: Self-pay | Admitting: *Deleted

## 2018-03-09 NOTE — Telephone Encounter (Signed)
Left message on voicemail per DPR in reference to upcoming appointment scheduled on 03/10/18 with detailed instructions given per Myocardial Perfusion Study Information Sheet for the test. LM to arrive 15 minutes early, and that it is imperative to arrive on time for appointment to keep from having the test rescheduled. If you need to cancel or reschedule your appointment, please call the office within 24 hours of your appointment. Failure to do so may result in a cancellation of your appointment, and a $50 no show fee. Phone number given for call back for any questions. Unika Nazareno Jacqueline    

## 2018-03-11 ENCOUNTER — Ambulatory Visit (HOSPITAL_COMMUNITY): Payer: No Typology Code available for payment source | Attending: Cardiovascular Disease

## 2018-03-11 DIAGNOSIS — R9439 Abnormal result of other cardiovascular function study: Secondary | ICD-10-CM | POA: Insufficient documentation

## 2018-03-11 LAB — MYOCARDIAL PERFUSION IMAGING
CHL CUP NUCLEAR SSS: 4
LHR: 0.36
LVDIAVOL: 97 mL (ref 46–106)
LVSYSVOL: 28 mL
NUC STRESS TID: 1.32
Peak HR: 102 {beats}/min
Rest HR: 59 {beats}/min
SDS: 2
SRS: 2

## 2018-03-11 MED ORDER — TECHNETIUM TC 99M TETROFOSMIN IV KIT
32.6000 | PACK | Freq: Once | INTRAVENOUS | Status: AC | PRN
  Administered 2018-03-11: 32.6 via INTRAVENOUS
  Filled 2018-03-11: qty 33

## 2018-03-11 MED ORDER — TECHNETIUM TC 99M TETROFOSMIN IV KIT
9.8000 | PACK | Freq: Once | INTRAVENOUS | Status: AC | PRN
  Administered 2018-03-11: 9.8 via INTRAVENOUS
  Filled 2018-03-11: qty 10

## 2018-03-11 MED ORDER — REGADENOSON 0.4 MG/5ML IV SOLN
0.4000 mg | Freq: Once | INTRAVENOUS | Status: AC
  Administered 2018-03-11: 0.4 mg via INTRAVENOUS

## 2018-03-14 ENCOUNTER — Other Ambulatory Visit (INDEPENDENT_AMBULATORY_CARE_PROVIDER_SITE_OTHER): Payer: No Typology Code available for payment source

## 2018-03-14 DIAGNOSIS — E0500 Thyrotoxicosis with diffuse goiter without thyrotoxic crisis or storm: Secondary | ICD-10-CM | POA: Diagnosis not present

## 2018-03-14 LAB — TSH: TSH: 0.21 u[IU]/mL — ABNORMAL LOW (ref 0.35–4.50)

## 2018-03-15 ENCOUNTER — Other Ambulatory Visit: Payer: Self-pay

## 2018-03-15 DIAGNOSIS — R7989 Other specified abnormal findings of blood chemistry: Secondary | ICD-10-CM

## 2018-03-25 NOTE — Progress Notes (Signed)
Office Visit Note  Patient: Diana Lopez             Date of Birth: [birth year]           MRN: [id number]             PCP: Noel JourneyMartin, William C, PA-C Referring: Waldon MerlMartin, William C, PA-C Visit Date: 04/08/2018 Occupation: @GUAROCC @  Subjective:  Pain in joints   History of Present Illness: Diana Lopez is a 42 y.o. female with history of knee joint pain and positive ANA.  She continues to have discomfort in her bilateral knee joints.  She also has a stiffness in her hands.  She has been experiencing numbness in her left hand.  Activities of Daily Living:  Patient reports morning stiffness for 0 none.   Patient Denies nocturnal pain.  Difficulty dressing/grooming: Denies Difficulty climbing stairs: Denies Difficulty getting out of chair: Denies Difficulty using hands for taps, buttons, cutlery, and/or writing: Reports  Review of Systems  Constitutional: Negative for fatigue.  HENT: Negative for mouth dryness.   Eyes: Negative for dryness.  Respiratory: Negative for shortness of breath.   Cardiovascular: Negative for swelling in legs/feet.  Gastrointestinal: Negative for constipation.  Endocrine: Negative for increased urination.  Genitourinary: Negative for difficulty urinating.  Musculoskeletal: Positive for arthralgias and joint pain.  Skin: Negative for rash.  Neurological: Negative for numbness.  Hematological: Negative for bruising/bleeding tendency.  Psychiatric/Behavioral: Negative for sleep disturbance.    PMFS History:  Patient Active Problem List   Diagnosis Date Noted  . History of gastroesophageal reflux (GERD) 03/07/2018  . Elevated TSH 01/31/2018  . Elevated sed rate 01/31/2018  . Anxiety and depression 01/11/2018  . Chronic fatigue 01/11/2018    Past Medical History:  Diagnosis Date  . Anxiety   . GERD (gastroesophageal reflux disease)     Family History  Problem Relation Age of Onset  . Hypertension Mother   . Heart disease Mother   . Heart  disease Father   . Hypertension Father   . Diabetes Father   . Diabetes Sister   . Diabetes Sister   . Healthy Daughter    Past Surgical History:  Procedure Laterality Date  . CARPAL TUNNEL RELEASE Right 2017  . CESAREAN SECTION     Social History   Social History Narrative  . Not on file    Objective: Vital Signs: BP 111/63 (BP Location: Left Arm, Patient Position: Sitting, Cuff Size: Normal)   Pulse (!) 59   Resp 14   Ht 5\' 4"  (1.626 m)   Wt 218 lb (98.9 kg)   LMP 04/01/2018   BMI 37.42 kg/m    Physical Exam  Constitutional: She is oriented to person, place, and time. She appears well-developed and well-nourished.  HENT:  Head: Normocephalic and atraumatic.  Eyes: Conjunctivae and EOM are normal.  Neck: Normal range of motion.  Cardiovascular: Normal rate, regular rhythm, normal heart sounds and intact distal pulses.  Pulmonary/Chest: Effort normal and breath sounds normal.  Abdominal: Soft. Bowel sounds are normal.  Lymphadenopathy:    She has no cervical adenopathy.  Neurological: She is alert and oriented to person, place, and time.  Skin: Skin is warm and dry. Capillary refill takes less than 2 seconds.  Psychiatric: She has a normal mood and affect. Her behavior is normal.  Nursing note and vitals reviewed.    Musculoskeletal Exam: C-spine thoracic lumbar spine good range of motion.  Shoulder joints elbow joints wrist joints are in good  range of motion.  She has mild DIP thickening in her hands consistent with osteoarthritis.  Hip joints knee joints ankles MTPs PIPs were in good range of motion with no synovitis.  CDAI Exam: CDAI Homunculus Exam:   Joint Counts:  CDAI Tender Joint count: 0 CDAI Swollen Joint count: 0    Investigation: Findings:  March 07, 2018 AVISE index -0.3 ANA 1: 320 homogeneous, dsDNA Smith, RNP, SSA, SSB, SCL 70, Jo 1-, CB CAP :EC4d 18+, anticardiolipin negative, beta-2 negative, thyroglobulin antibody positive, antithyroid  peroxidase antibody positive   Imaging: No results found.  Recent Labs: Lab Results  Component Value Date   WBC 9.8 01/26/2018   HGB 12.0 01/26/2018   PLT 371 01/26/2018   NA 137 01/05/2018   K 4.8 01/05/2018   CL 105 01/05/2018   CO2 22 01/05/2018   GLUCOSE 89 01/05/2018   BUN 13 01/05/2018   CREATININE 0.85 01/05/2018   BILITOT 0.3 01/05/2018   AST 18 01/05/2018   ALT 13 01/05/2018   PROT 7.1 01/05/2018   CALCIUM 9.2 01/05/2018    Speciality Comments: No specialty comments available.  Procedures:  No procedures performed Allergies: Wellbutrin [bupropion]   Assessment / Plan:     Visit Diagnoses: Primary osteoarthritis of right knee - mild osteoarthritis and moderate chondromalacia patella.  Detailed counseling regarding osteoarthritis was provided.  Weight loss diet and exercise was discussed.  I offered physical therapy which she declined.  Have given her a handout on knee joint exercises.  A prescription for diclofenac gel was given which can be used topically.  A list of natural anti-inflammatories was given.  Positive ANA (antinuclear antibody) - ANA 1:80 NH, AVISE index -0.3.  At this point patient does not have any clinical features of autoimmune disease.  I have advised her to contact me in case she develops any new symptoms.  She does have antithyroid antibodies which could be contributing to positive ANA.  Carpal tunnel syndrome of left wrist-complains of paresthesias in her left hand.  She had positive manual compression test and Tinel's test.  She does not want to have nerve conduction velocity test.  Have given her prescription for left carpal tunnel brace.  If her symptoms get worse she is supposed to notify me.  Other insomnia-good sleep hygiene was discussed.  Other fatigue-fatigue appears to be related to insomnia.  Anxiety and depression-she is on treatment.  History of gastroesophageal reflux (GERD)   Orders: No orders of the defined types were  placed in this encounter.  Meds ordered this encounter  Medications  . diclofenac sodium (VOLTAREN) 1 % GEL    Sig: Apply 3 gm to 3 large joints up to 3 times a day.Dispense 3 tubes with 3 refills.    Dispense:  3 Tube    Refill:  0    Face-to-face time spent with patient was 30 minutes. Greater than 50% of time was spent in counseling and coordination of care.  Follow-Up Instructions: Return in about 1 year (around 04/09/2019) for Osteoarthritis, +ANA.   Pollyann Savoy, MD  Note - This record has been created using Animal nutritionist.  Chart creation errors have been sought, but may not always  have been located. Such creation errors do not reflect on  the standard of medical care.

## 2018-03-29 ENCOUNTER — Other Ambulatory Visit: Payer: No Typology Code available for payment source

## 2018-03-29 ENCOUNTER — Other Ambulatory Visit (INDEPENDENT_AMBULATORY_CARE_PROVIDER_SITE_OTHER): Payer: No Typology Code available for payment source

## 2018-03-29 DIAGNOSIS — R7989 Other specified abnormal findings of blood chemistry: Secondary | ICD-10-CM

## 2018-03-30 ENCOUNTER — Other Ambulatory Visit: Payer: No Typology Code available for payment source

## 2018-03-30 LAB — TSH: TSH: 0.04 m[IU]/L — AB

## 2018-03-30 LAB — T4, FREE: FREE T4: 1.2 ng/dL (ref 0.8–1.8)

## 2018-03-31 ENCOUNTER — Other Ambulatory Visit: Payer: Self-pay

## 2018-03-31 DIAGNOSIS — R7989 Other specified abnormal findings of blood chemistry: Secondary | ICD-10-CM

## 2018-03-31 MED ORDER — LEVOTHYROXINE SODIUM 25 MCG PO TABS: 25.0000 ug | ORAL_TABLET | Freq: Every day | ORAL | 1 refills | Status: DC

## 2018-04-08 ENCOUNTER — Encounter: Payer: Self-pay | Admitting: Rheumatology

## 2018-04-08 ENCOUNTER — Ambulatory Visit (INDEPENDENT_AMBULATORY_CARE_PROVIDER_SITE_OTHER): Payer: No Typology Code available for payment source | Admitting: Rheumatology

## 2018-04-08 VITALS — BP 111/63 | HR 59 | Resp 14 | Ht 64.0 in | Wt 218.0 lb

## 2018-04-08 DIAGNOSIS — F329 Major depressive disorder, single episode, unspecified: Secondary | ICD-10-CM

## 2018-04-08 DIAGNOSIS — G5602 Carpal tunnel syndrome, left upper limb: Secondary | ICD-10-CM

## 2018-04-08 DIAGNOSIS — M1711 Unilateral primary osteoarthritis, right knee: Secondary | ICD-10-CM

## 2018-04-08 DIAGNOSIS — R768 Other specified abnormal immunological findings in serum: Secondary | ICD-10-CM | POA: Diagnosis not present

## 2018-04-08 DIAGNOSIS — G4709 Other insomnia: Secondary | ICD-10-CM

## 2018-04-08 DIAGNOSIS — R5383 Other fatigue: Secondary | ICD-10-CM

## 2018-04-08 DIAGNOSIS — F419 Anxiety disorder, unspecified: Secondary | ICD-10-CM

## 2018-04-08 DIAGNOSIS — R7689 Other specified abnormal immunological findings in serum: Secondary | ICD-10-CM

## 2018-04-08 DIAGNOSIS — Z8719 Personal history of other diseases of the digestive system: Secondary | ICD-10-CM

## 2018-04-08 MED ORDER — DICLOFENAC SODIUM 1 % TD GEL: TRANSDERMAL | 0 refills | Status: DC

## 2018-04-08 NOTE — Patient Instructions (Addendum)

## 2018-04-12 ENCOUNTER — Telehealth: Payer: Self-pay | Admitting: Physician Assistant

## 2018-04-12 LAB — EXERCISE TOLERANCE TEST
CHL CUP MPHR: 176 {beats}/min
CHL RATE OF PERCEIVED EXERTION: 15
CSEPED: 5 min
CSEPHR: 88 %
Estimated workload: 7 METS
Exercise duration (sec): 20 s
Peak HR: 155 {beats}/min
Rest HR: 81 {beats}/min

## 2018-04-12 NOTE — Telephone Encounter (Signed)
Copied from CRM [id number]. Topic: Quick Communication - See Telephone Encounter >> Apr 12, 2018  2:55 PM Lorrine KinMcGee, Kentavius Dettore B, NT wrote: CRM for notification. See Telephone encounter for: 04/12/18. Patient calling and states that she received a call from [place]. States that she has to tell her something else. Would not go into details. Please advise.

## 2018-04-13 ENCOUNTER — Ambulatory Visit: Payer: No Typology Code available for payment source | Admitting: Physician Assistant

## 2018-04-13 NOTE — Telephone Encounter (Signed)
Noted. Will discuss at visit. 

## 2018-04-13 NOTE — Telephone Encounter (Signed)
Called patient and she stated that she recently took Azithromycin and patient stated that she thinks this is making her more tired. Patient also stated that she feels more anxious and not sure if she needs to adjust her medication. Also patient stated that her work vehicle is around 115 degrees and the heat could be draining her also.

## 2018-04-19 ENCOUNTER — Encounter: Payer: Self-pay | Admitting: Physician Assistant

## 2018-04-19 ENCOUNTER — Ambulatory Visit: Payer: No Typology Code available for payment source | Admitting: Physician Assistant

## 2018-04-19 ENCOUNTER — Other Ambulatory Visit: Payer: Self-pay

## 2018-04-19 VITALS — BP 130/78 | HR 72 | Temp 98.1°F | Resp 17 | Ht 64.0 in | Wt 223.2 lb

## 2018-04-19 DIAGNOSIS — E039 Hypothyroidism, unspecified: Secondary | ICD-10-CM

## 2018-04-19 NOTE — Progress Notes (Signed)
Patient presents to clinic today for follow-up regarding mood and energy levels. Patient with history of autoimmune hypothyroidism,previously on 50 mcg daily but reduced to 25 mcg daily as TSH was too low. Notes worsening mood, energy levels and dry skin with reduction in dose. Is also noting sparseness of hair.   Past Medical History:  Diagnosis Date  . Anxiety   . GERD (gastroesophageal reflux disease)     Current Outpatient Medications on File Prior to Visit  Medication Sig Dispense Refill  . escitalopram (LEXAPRO) 20 MG tablet Take 1 tablet (20 mg total) by mouth daily. 30 tablet 5  . levothyroxine (SYNTHROID, LEVOTHROID) 25 MCG tablet Take 1 tablet (25 mcg total) by mouth daily before breakfast. 30 tablet 1  . ALPRAZolam (XANAX) 0.25 MG tablet Take 1 tablet (0.25 mg total) by mouth 2 (two) times daily as needed for anxiety. (Patient not taking: Reported on 02/14/2018) 10 tablet 0  . diclofenac sodium (VOLTAREN) 1 % GEL Apply 3 gm to 3 large joints up to 3 times a day.Dispense 3 tubes with 3 refills. (Patient not taking: Reported on 04/19/2018) 3 Tube 0  . levonorgestrel-ethinyl estradiol (AVIANE,ALESSE,LESSINA) 0.1-20 MG-MCG tablet Take 1 tablet by mouth daily.    Marland Kitchen. loratadine (CLARITIN) 10 MG tablet Take 10 mg by mouth daily.     No current facility-administered medications on file prior to visit.     Allergies  Allergen Reactions  . Wellbutrin [Bupropion]     Panic, Anxiety, Jitteriness    Family History  Problem Relation Age of Onset  . Hypertension Mother   . Heart disease Mother   . Heart disease Father   . Hypertension Father   . Diabetes Father   . Diabetes Sister   . Diabetes Sister   . Healthy Daughter     Social History   Socioeconomic History  . Marital status: Single    Spouse name: Not on file  . Number of children: Not on file  . Years of education: Not on file  . Highest education level: Not on file  Occupational History  . Not on file  Social  Needs  . Financial resource strain: Not on file  . Food insecurity:    Worry: Not on file    Inability: Not on file  . Transportation needs:    Medical: Not on file    Non-medical: Not on file  Tobacco Use  . Smoking status: Never Smoker  . Smokeless tobacco: Never Used  Substance and Sexual Activity  . Alcohol use: No  . Drug use: Never  . Sexual activity: Not Currently  Lifestyle  . Physical activity:    Days per week: Not on file    Minutes per session: Not on file  . Stress: Not on file  Relationships  . Social connections:    Talks on phone: Not on file    Gets together: Not on file    Attends religious service: Not on file    Active member of club or organization: Not on file    Attends meetings of clubs or organizations: Not on file    Relationship status: Not on file  Other Topics Concern  . Not on file  Social History Narrative  . Not on file   Review of Systems - See HPI.  All other ROS are negative.  BP 130/78   Pulse 72   Temp 98.1 F (36.7 C) (Oral)   Resp 17   Ht 5\' 4"  (1.626 m)  Wt 223 lb 3.2 oz (101.2 kg)   LMP 04/01/2018   SpO2 98%   BMI 38.31 kg/m   Physical Exam  Constitutional: She is oriented to person, place, and time. She appears well-developed and well-nourished.  HENT:  Head: Normocephalic and atraumatic.  Eyes: Conjunctivae are normal.  Neck: Neck supple. No thyromegaly present.  Cardiovascular: Normal rate, regular rhythm, normal heart sounds and intact distal pulses.  Pulmonary/Chest: Effort normal and breath sounds normal.  Neurological: She is alert and oriented to person, place, and time.  Psychiatric: Her speech is normal and behavior is normal. Judgment and thought content normal. She exhibits a depressed mood.  Vitals reviewed.  Recent Results (from the past 2160 hour(s))  Thyroid peroxidase antibody     Status: Abnormal   Collection Time: 01/26/18 11:18 AM  Result Value Ref Range   Thyroperoxidase Ab SerPl-aCnc 248 (H)  <9 IU/mL  Antinuclear Antib (ANA)     Status: Abnormal   Collection Time: 01/26/18 11:18 AM  Result Value Ref Range   Anti Nuclear Antibody(ANA) POSITIVE (A) NEGATIVE    Comment: ANA IFA is a first line screen for detecting the presence of up to approximately 150 autoantibodies in various autoimmune diseases. A positive ANA IFA result is suggestive of autoimmune disease and reflexes to titer and pattern. Further laboratory testing may be considered if clinically indicated. . Visit Physician FAQs for interpretation of all antibodies in the [place], prevalence, and association with diseases at http://education.[facility].com/ [email or web address] .   T4, free     Status: None   Collection Time: 01/26/18 11:18 AM  Result Value Ref Range   Free T4 1.0 0.8 - 1.8 ng/dL  Sedimentation rate     Status: Abnormal   Collection Time: 01/26/18 11:18 AM  Result Value Ref Range   Sed Rate 34 (H) 0 - 20 mm/h  TSH     Status: None   Collection Time: 01/26/18 11:18 AM  Result Value Ref Range   TSH 4.13 mIU/L    Comment:           Reference Range .           > or = 20 Years  0.40-4.50 .                Pregnancy Ranges           First trimester    0.26-2.66           Second trimester   0.55-2.73           Third trimester    0.43-2.91   CBC     Status: None   Collection Time: 01/26/18 11:18 AM  Result Value Ref Range   WBC 9.8 3.8 - 10.8 Thousand/uL   RBC 4.39 3.80 - 5.10 Million/uL   Hemoglobin 12.0 11.7 - 15.5 g/dL   HCT 40.9 81.1 - 91.4 %   MCV 83.6 80.0 - 100.0 fL   MCH 27.3 27.0 - 33.0 pg   MCHC 32.7 32.0 - 36.0 g/dL   RDW 78.2 95.6 - 21.3 %   Platelets 371 140 - 400 Thousand/uL   MPV 12.0 7.5 - 12.5 fL  CRP High sensitivity     Status: Abnormal   Collection Time: 01/26/18 11:18 AM  Result Value Ref Range   hs-CRP >10.0 (H) mg/L    Comment: Reference Range Optimal <1.0 Jellinger PS et al. Baruch Goldmann Pract.2017;23(Suppl 2):1-87. . For ages >4 Years: hs-CRP mg/L  Risk According to  AHA/CDC Guidelines <1.0  Lower relative cardiovascular risk. 1.0-3.0      Average relative cardiovascular risk. 3.1-10.0     Higher relative cardiovascular risk.              Consider retesting in 1 to 2 weeks to              exclude a benign transient elevation              in the baseline CRP value secondary              to infection or inflammation. >10.0        Persistent elevation, upon retesting,              may be associated with infection and              inflammation. Sheralyn Boatman ab-titer (ANA titer)     Status: Abnormal   Collection Time: 01/26/18 11:18 AM  Result Value Ref Range   ANA Pattern 1 HOMOGENEOUS (A)     Comment: Homogeneous pattern is associated with systemic lupus erythematosus (SLE), drug induced lupus and juvenile idiopathic arthritis.    ANA Titer 1 1:80 (H) titer    Comment: A low level ANA titer may be present in pre-clinical autoimmune diseases and normal individuals.                 Reference Range                 <1:40        Negative                 1:40-1:80    Low Antibody Level                 >1:80        Elevated Antibody Level .   Exercise Tolerance Test     Status: None   Collection Time: 03/04/18 11:39 AM  Result Value Ref Range   Rest HR 81 bpm   Rest BP 148/67 mmHg   RPE 15    Exercise duration (sec) 20 sec   Percent HR 88 %   Exercise duration (min) 5 min   Estimated workload 7.0 METS   Peak HR 155 bpm   Peak BP 185/74 mmHg   MPHR 176 bpm  MYOCARDIAL PERFUSION IMAGING     Status: None   Collection Time: 03/11/18 11:39 AM  Result Value Ref Range   Rest HR 59 bpm   Rest BP 148/87 mmHg   Peak HR 102 bpm   Peak BP 154/74 mmHg   SSS 4    SRS 2    SDS 2    LHR 0.36    TID 1.32    LV sys vol 28 mL   LV dias vol 97 46 - 106 mL  TSH     Status: Abnormal   Collection Time: 03/14/18 12:40 PM  Result Value Ref Range   TSH 0.21 (L) 0.35 - 4.50 uIU/mL  T4, free     Status: None   Collection Time: 03/29/18 11:14 AM   Result Value Ref Range   Free T4 1.2 0.8 - 1.8 ng/dL  TSH     Status: Abnormal   Collection Time: 03/29/18 11:14 AM  Result Value Ref Range   TSH 0.04 (L) mIU/L    Comment:           Reference Range .           >  or = 20 Years  0.40-4.50 .                Pregnancy Ranges           First trimester    0.26-2.66           Second trimester   0.55-2.73           Third trimester    0.43-2.91     Assessment/Plan: 1. Hypothyroidism (acquired) Autoimmune. Noting significant fatigue and decline in mood since decrease in dose. Concern thyroid function is continuing to deteriorate. Will repeat TSH, T4 and check CBC today. Will alter regimen according to results. She has been encouraged to follow-up with her Rheumatologist. Referal to Endo placed.  - CBC w/Diff - T4, free - TSH   Piedad Climes, PA-C

## 2018-04-19 NOTE — Patient Instructions (Addendum)
Please go to the lab today for blood work.  I will call you with your results. We will alter treatment regimen(s) if indicated by your results.   We will also see how mood reacts to changes made in your thyroid medication. Then we can alter your anxiety medications.  It is ok to use your Diclofenac jail for arthritic pains.  I will call you in the morning to discuss results and next steps.

## 2018-04-20 ENCOUNTER — Other Ambulatory Visit: Payer: Self-pay | Admitting: Physician Assistant

## 2018-04-20 DIAGNOSIS — E039 Hypothyroidism, unspecified: Secondary | ICD-10-CM

## 2018-04-20 LAB — CBC WITH DIFFERENTIAL/PLATELET
BASOS ABS: 110 {cells}/uL (ref 0–200)
BASOS PCT: 0.9 %
EOS ABS: 464 {cells}/uL (ref 15–500)
Eosinophils Relative: 3.8 %
HCT: 37.6 % (ref 35.0–45.0)
Hemoglobin: 12.4 g/dL (ref 11.7–15.5)
Lymphs Abs: 3367 cells/uL (ref 850–3900)
MCH: 25.8 pg — AB (ref 27.0–33.0)
MCHC: 33 g/dL (ref 32.0–36.0)
MCV: 78.2 fL — AB (ref 80.0–100.0)
MPV: 11.6 fL (ref 7.5–12.5)
Monocytes Relative: 8.2 %
NEUTROS PCT: 59.5 %
Neutro Abs: 7259 cells/uL (ref 1500–7800)
PLATELETS: 363 10*3/uL (ref 140–400)
RBC: 4.81 10*6/uL (ref 3.80–5.10)
RDW: 13.7 % (ref 11.0–15.0)
TOTAL LYMPHOCYTE: 27.6 %
WBC: 12.2 10*3/uL — ABNORMAL HIGH (ref 3.8–10.8)
WBCMIX: 1000 {cells}/uL — AB (ref 200–950)

## 2018-04-20 LAB — T4, FREE: Free T4: 0.8 ng/dL (ref 0.8–1.8)

## 2018-04-20 LAB — TSH: TSH: 1.32 mIU/L

## 2018-04-20 MED ORDER — LEVOTHYROXINE SODIUM 50 MCG PO TABS: 50.0000 ug | ORAL_TABLET | Freq: Every day | ORAL | 1 refills | Status: DC

## 2018-05-11 ENCOUNTER — Other Ambulatory Visit (INDEPENDENT_AMBULATORY_CARE_PROVIDER_SITE_OTHER): Payer: No Typology Code available for payment source

## 2018-05-11 DIAGNOSIS — R7989 Other specified abnormal findings of blood chemistry: Secondary | ICD-10-CM | POA: Diagnosis not present

## 2018-05-12 ENCOUNTER — Other Ambulatory Visit: Payer: No Typology Code available for payment source

## 2018-05-12 ENCOUNTER — Other Ambulatory Visit: Payer: Self-pay | Admitting: Physician Assistant

## 2018-05-12 DIAGNOSIS — E0500 Thyrotoxicosis with diffuse goiter without thyrotoxic crisis or storm: Secondary | ICD-10-CM

## 2018-05-12 LAB — TSH: TSH: 6.49 m[IU]/L — AB

## 2018-05-12 MED ORDER — LEVOTHYROXINE SODIUM 75 MCG PO TABS: 75.0000 ug | ORAL_TABLET | Freq: Every day | ORAL | 3 refills | Status: DC

## 2018-06-01 ENCOUNTER — Ambulatory Visit (INDEPENDENT_AMBULATORY_CARE_PROVIDER_SITE_OTHER): Payer: No Typology Code available for payment source | Admitting: Endocrinology

## 2018-06-01 ENCOUNTER — Encounter: Payer: Self-pay | Admitting: Endocrinology

## 2018-06-01 VITALS — BP 142/88 | HR 69 | Ht 64.0 in | Wt 226.6 lb

## 2018-06-01 DIAGNOSIS — E063 Autoimmune thyroiditis: Secondary | ICD-10-CM | POA: Diagnosis not present

## 2018-06-01 NOTE — Progress Notes (Signed)
Patient ID: Diana Lopez, female   DOB: May 15, 1976, 42 y.o.   MRN: [id number]           Referring Provider: Marcelline Mates, PA  Reason for Appointment:  Hypothyroidism, new visit    History of Present Illness:   Hypothyroidism was first diagnosed in 3/19  At the time of diagnosis patient was having symptoms of  fatigue which had been present for a few years She was not complaining of any cold sensitivity, dry skin but was having some hair loss .            TSH done from outside lab initially had shown her TSH level to be 5.8 in 3/19 However she was not started on thyroid supplementation until 5/19 when she was seen by her new PCP At that time baseline TSH was 4.7 from a different lab  The patient has been treated with  initially 50 mcg levothyroxine and then this was reduced down to 25 when her TSH went down to 0.2 However subsequently she was placed back on 50 mcg In 9/19 when she was tested on 50 mcg her TSH was up to 6.5 and she was then started on 75 mcg levothyroxine  With starting thyroid supplementation the patient's symptoms did not improve and she continues to have significant fatigue and daytime somnolence She says she does not feel any better even when her TSH levels were normal Only once when she took a second levothyroxine pill by mistake she thinks she felt less tired  The patient takes the thyroid supplement before breakfast daily         Patient's weight history is as follows:  Wt Readings from Last 3 Encounters:  06/01/18 226 lb 9.6 oz (102.8 kg)  04/19/18 223 lb 3.2 oz (101.2 kg)  04/08/18 218 lb (98.9 kg)    Thyroid function results have been as follows:  Lab Results  Component Value Date   TSH 6.49 (H) 05/11/2018   TSH 1.32 04/19/2018   TSH 0.04 (L) 03/29/2018   TSH 0.21 (L) 03/14/2018   FREET4 0.8 04/19/2018   FREET4 1.2 03/29/2018   FREET4 1.0 01/26/2018   FREET4 0.9 01/05/2018     Past Medical History:  Diagnosis Date  . Anxiety   .  GERD (gastroesophageal reflux disease)     Past Surgical History:  Procedure Laterality Date  . CARPAL TUNNEL RELEASE Right 2017  . CESAREAN SECTION      Family History  Problem Relation Age of Onset  . Hypertension Mother   . Heart disease Mother   . Heart disease Father   . Hypertension Father   . Diabetes Father   . Diabetes Sister   . Diabetes Sister   . Healthy Daughter     Social History:  reports that she has never smoked. She has never used smokeless tobacco. She reports that she does not drink alcohol or use drugs.  Allergies:  Allergies  Allergen Reactions  . Wellbutrin [Bupropion]     Panic, Anxiety, Jitteriness    Allergies as of 06/01/2018      Reactions   Wellbutrin [bupropion]    Panic, Anxiety, Jitteriness      Medication List        Accurate as of 06/01/18  4:09 PM. Always use your most recent med list.          ALPRAZolam 0.25 MG tablet Commonly known as:  XANAX Take 1 tablet (0.25 mg total) by mouth 2 (two) times daily  as needed for anxiety.   CLARITIN 10 MG tablet Generic drug:  loratadine Take 10 mg by mouth daily.   diclofenac sodium 1 % Gel Commonly known as:  VOLTAREN Apply 3 gm to 3 large joints up to 3 times a day.Dispense 3 tubes with 3 refills.   escitalopram 20 MG tablet Commonly known as:  LEXAPRO Take 1 tablet (20 mg total) by mouth daily.   levonorgestrel-ethinyl estradiol 0.1-20 MG-MCG tablet Commonly known as:  AVIANE,ALESSE,LESSINA Take 1 tablet by mouth daily.   levothyroxine 75 MCG tablet Commonly known as:  SYNTHROID, LEVOTHROID Take 1 tablet (75 mcg total) by mouth daily.          Review of Systems  Constitutional: Positive for weight loss and diaphoresis.       Has been gaining 10-15 lbs in the last 3 to 4 months.  Tends to sweat more  HENT: Negative for trouble swallowing.   Respiratory:       She gets out of breath on exertion  Cardiovascular: Negative for palpitations and leg swelling.    Gastrointestinal: Positive for constipation.       Constipation is recently better  Endocrine: Negative for cold intolerance.  Musculoskeletal: Positive for back pain.  Skin: Positive for dry skin.       Has had mild hair loss.  No history of excessive facial hair  Neurological: Positive for numbness.  Psychiatric/Behavioral: Positive for nervousness.       Anxiety is better with Lexapro                Examination:    BP (!) 142/88 (BP Location: Right Arm, Patient Position: Sitting, Cuff Size: Normal)   Pulse 69   Ht 5\' 4"  (1.626 m)   Wt 226 lb 9.6 oz (102.8 kg)   SpO2 96%   BMI 38.90 kg/m   GENERAL:  Large build.  Has generalized obesity, no cushingoid features  No pallor.    Skin:  no rash.  Minimal acanthosis of the neck present  EYES:  No prominence of the eyes or swelling of the eyelids  ENT: Oral mucosa and tongue normal.  NECK: No lymphadenopathy  THYROID: It is palpable on the right side, about twice normal, somewhat irregular and rubbery in texture, nontender.  Left lobe not palpable  HEART:  Normal  S1 and S2; no murmur or click.  CHEST:    Lungs: Vescicular breath sounds heard equally.  No crepitations/ wheeze.  ABDOMEN:  No distention.  Liver and spleen not palpable. No other mass or tenderness.  NEUROLOGICAL: Reflexes are bilaterally slightly brisk at ankles and normal at biceps.  EXTREMITIES:  Normal peripheral joints.  No ankle edema present   Assessment:  HYPOTHYROIDISM, autoimmune with small goiter  FATIGUE: This is long-standing Unlikely the patient's fatigue is related to hypothyroidism since symptoms have been going on for a few years and also with treatment and normalization of her TSH she did not feel any better  PLAN:   Check thyroid levels including T3 and decide on her treatment regimen Explained to the patient the nature of autoimmune thyroid disease and patient information handout given  Recommended that she follow-up with  her PCP to evaluate her fatigue with further testing such as sleep apnea evaluation  Follow-up 6 weeks   Reather Littler 06/01/2018, 4:09 PM   Consultation note copy sent to the PCP  Note: This office note was prepared with Dragon voice recognition system technology. Any transcriptional errors that result from this  process are unintentional.

## 2018-06-02 ENCOUNTER — Telehealth: Payer: Self-pay | Admitting: Endocrinology

## 2018-06-02 LAB — T4, FREE: Free T4: 1.1 ng/dL (ref 0.8–1.8)

## 2018-06-02 LAB — TSH: TSH: 2.08 mIU/L

## 2018-06-02 LAB — T3, FREE: T3, Free: 2.8 pg/mL (ref 2.3–4.2)

## 2018-06-02 NOTE — Telephone Encounter (Signed)
[facility] "Caller states she missed a call."

## 2018-06-02 NOTE — Telephone Encounter (Signed)
Patient did not get a call from this office

## 2018-06-08 ENCOUNTER — Other Ambulatory Visit: Payer: Self-pay

## 2018-06-08 NOTE — Telephone Encounter (Signed)
Sent in new prescription of levothyroxine 50 mcg daily and liothyronine 5 mcg daily

## 2018-06-13 ENCOUNTER — Telehealth: Payer: Self-pay

## 2018-06-13 ENCOUNTER — Telehealth: Payer: Self-pay | Admitting: Endocrinology

## 2018-06-13 ENCOUNTER — Other Ambulatory Visit: Payer: Self-pay | Admitting: Endocrinology

## 2018-06-13 MED ORDER — LIOTHYRONINE SODIUM 5 MCG PO TABS: 5.0000 ug | ORAL_TABLET | Freq: Every day | ORAL | 2 refills | Status: DC

## 2018-06-13 MED ORDER — LEVOTHYROXINE SODIUM 50 MCG PO TABS: 50.0000 ug | ORAL_TABLET | Freq: Every day | ORAL | 3 refills | Status: DC

## 2018-06-13 NOTE — Telephone Encounter (Signed)
Rx levothyroxine 50 mcg and liothyronine 5 mcg sent to [facility])

## 2018-06-13 NOTE — Telephone Encounter (Signed)
error 

## 2018-06-13 NOTE — Telephone Encounter (Signed)
Done

## 2018-06-13 NOTE — Telephone Encounter (Signed)
Per [facility] "Caller states prescription was supposed to be sent on Wednesday. Pharmacy just  Stated it does not have it. Two different Thyroid medication."

## 2018-06-13 NOTE — Telephone Encounter (Signed)
Patient is stating that her two thyriod medications have not been filled. Does not remember name. States nurse told her she was filling them and she said ok. Please Advise.  Ph # [phone]

## 2018-06-13 NOTE — Telephone Encounter (Signed)
Sent Rx levothyroxine and liothyronine pharmacy.

## 2018-07-18 ENCOUNTER — Encounter: Payer: Self-pay | Admitting: Endocrinology

## 2018-07-18 ENCOUNTER — Ambulatory Visit (INDEPENDENT_AMBULATORY_CARE_PROVIDER_SITE_OTHER): Payer: No Typology Code available for payment source | Admitting: Endocrinology

## 2018-07-18 ENCOUNTER — Ambulatory Visit: Payer: No Typology Code available for payment source | Admitting: Internal Medicine

## 2018-07-18 VITALS — BP 118/70 | HR 71 | Ht 64.0 in | Wt 222.8 lb

## 2018-07-18 DIAGNOSIS — E063 Autoimmune thyroiditis: Secondary | ICD-10-CM | POA: Diagnosis not present

## 2018-07-18 LAB — T3, FREE: T3 FREE: 3.3 pg/mL (ref 2.3–4.2)

## 2018-07-18 LAB — T4, FREE: FREE T4: 0.69 ng/dL (ref 0.60–1.60)

## 2018-07-18 LAB — TSH: TSH: 2.53 u[IU]/mL (ref 0.35–4.50)

## 2018-07-18 NOTE — Progress Notes (Signed)
Patient ID: Diana Lopez, female   DOB: May 04, 1976, 42 y.o.   MRN: [id number]           Referring Provider: Marcelline MatesWilliam Martin, PA  Reason for Appointment:  Hypothyroidism, follow-up visit    History of Present Illness:   Hypothyroidism was first diagnosed in 3/19  At the time of diagnosis patient was having symptoms of  fatigue which had been present for a few years She was not complaining of any cold sensitivity, dry skin but was having some hair loss .            TSH done from outside lab initially had shown her TSH level to be 5.8 in 3/19 However she was not started on thyroid supplementation until 5/19 when she was seen by her new PCP At that time baseline TSH was 4.7 from a different lab  The patient has been treated with  initially 50 mcg levothyroxine and then this was reduced down to 25 when her TSH went down to 0.2 However subsequently she was placed back on 50 mcg In 9/19 when she was tested on 50 mcg her TSH was up to 6.5 and she was then started on 75 mcg levothyroxine  With starting thyroid supplementation with levothyroxine the patient's symptoms did not improve and she continues to have significant fatigue and daytime somnolence  RECENT HISTORY: On her initial visit in 10/19 her levothyroxine was reduced to 50 mcg and Cytomel 5 mcg added empirically; baseline free T3 was 2.8 with normal TSH  She does feel better with significantly less fatigue and somnolence, sleeping better at night also Her weight appears to be down about 4 pounds No unusual cold intolerance She is taking both supplements in the mornings as instructed before breakfast Labs pending         Patient's weight history is as follows:  Wt Readings from Last 3 Encounters:  07/18/18 222 lb 12.8 oz (101.1 kg)  06/01/18 226 lb 9.6 oz (102.8 kg)  04/19/18 223 lb 3.2 oz (101.2 kg)    Thyroid function results have been as follows:  Lab Results  Component Value Date   TSH 2.08 06/01/2018   TSH 6.49  (H) 05/11/2018   TSH 1.32 04/19/2018   TSH 0.04 (L) 03/29/2018   FREET4 1.1 06/01/2018   FREET4 0.8 04/19/2018   FREET4 1.2 03/29/2018   FREET4 1.0 01/26/2018   T3FREE 2.8 06/01/2018     Past Medical History:  Diagnosis Date  . Anxiety   . GERD (gastroesophageal reflux disease)     Past Surgical History:  Procedure Laterality Date  . CARPAL TUNNEL RELEASE Right 2017  . CESAREAN SECTION      Family History  Problem Relation Age of Onset  . Hypertension Mother   . Heart disease Mother   . Heart disease Father   . Hypertension Father   . Diabetes Father   . Diabetes Sister   . Diabetes Sister   . Healthy Daughter   . Thyroid disease Neg Hx     Social History:  reports that she has never smoked. She has never used smokeless tobacco. She reports that she does not drink alcohol or use drugs.  Allergies:  Allergies  Allergen Reactions  . Wellbutrin [Bupropion]     Panic, Anxiety, Jitteriness    Allergies as of 07/18/2018      Reactions   Wellbutrin [bupropion]    Panic, Anxiety, Jitteriness      Medication List  Accurate as of 07/18/18  3:55 PM. Always use your most recent med list.          ALPRAZolam 0.25 MG tablet Commonly known as:  XANAX Take 1 tablet (0.25 mg total) by mouth 2 (two) times daily as needed for anxiety.   diclofenac sodium 1 % Gel Commonly known as:  VOLTAREN Apply 3 gm to 3 large joints up to 3 times a day.Dispense 3 tubes with 3 refills.   escitalopram 20 MG tablet Commonly known as:  LEXAPRO Take 1 tablet (20 mg total) by mouth daily.   levonorgestrel-ethinyl estradiol 0.1-20 MG-MCG tablet Commonly known as:  AVIANE,ALESSE,LESSINA Take 1 tablet by mouth daily.   levothyroxine 50 MCG tablet Commonly known as:  SYNTHROID, LEVOTHROID Take 1 tablet (50 mcg total) by mouth daily. Take 1 tablet before breakfast   liothyronine 5 MCG tablet Commonly known as:  CYTOMEL Take 1 tablet (5 mcg total) by mouth daily. Take 1  tablet before breakfast.          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 Any transcriptional errors that result from this process are unintentional.

## 2018-07-18 NOTE — Progress Notes (Signed)
Please call to let patient know that the thyroid results are normal and no change in treatment needed

## 2018-08-23 ENCOUNTER — Other Ambulatory Visit: Payer: Self-pay | Admitting: Physician Assistant

## 2018-08-23 DIAGNOSIS — F419 Anxiety disorder, unspecified: Principal | ICD-10-CM

## 2018-08-23 DIAGNOSIS — F329 Major depressive disorder, single episode, unspecified: Secondary | ICD-10-CM

## 2018-08-23 MED ORDER — ESCITALOPRAM OXALATE 20 MG PO TABS: 20.0000 mg | ORAL_TABLET | Freq: Every day | ORAL | 0 refills | Status: DC

## 2018-08-23 NOTE — Telephone Encounter (Signed)
Copied from CRM [phone]#[id number]. Topic: Quick Communication - Rx Refill/Question >> Aug 23, 2018 10:09 AM Arlyss Gandyichardson, Adeliz Tonkinson N, NT wrote: Medication: escitalopram (LEXAPRO) 20 MG tablet   Has the patient contacted their pharmacy? Yes.   (Agent: If no, request that the patient contact the pharmacy for the refill.) (Agent: If yes, when and what did the pharmacy advise?)  Preferred Pharmacy (with phone number or street name): [facility] [address] - [place], [place] - [phone] (Phone) [phone] (Fax)    Agent: Please be advised that RX refills may take up to 3 business days. We ask that you follow-up with your pharmacy.

## 2018-08-30 ENCOUNTER — Ambulatory Visit: Payer: No Typology Code available for payment source | Admitting: Psychology

## 2018-08-30 DIAGNOSIS — F4321 Adjustment disorder with depressed mood: Secondary | ICD-10-CM | POA: Diagnosis not present

## 2018-08-31 ENCOUNTER — Other Ambulatory Visit: Payer: Self-pay

## 2018-08-31 ENCOUNTER — Other Ambulatory Visit (INDEPENDENT_AMBULATORY_CARE_PROVIDER_SITE_OTHER): Payer: No Typology Code available for payment source

## 2018-08-31 ENCOUNTER — Telehealth: Payer: Self-pay | Admitting: Endocrinology

## 2018-08-31 ENCOUNTER — Other Ambulatory Visit: Payer: Self-pay | Admitting: Endocrinology

## 2018-08-31 DIAGNOSIS — E063 Autoimmune thyroiditis: Secondary | ICD-10-CM | POA: Diagnosis not present

## 2018-08-31 MED ORDER — LEVOTHYROXINE SODIUM 50 MCG PO TABS: 50.0000 ug | ORAL_TABLET | Freq: Every day | ORAL | 3 refills | Status: DC

## 2018-08-31 NOTE — Telephone Encounter (Signed)
Please advise 

## 2018-08-31 NOTE — Telephone Encounter (Signed)
levothyroxine (SYNTHROID, LEVOTHROID) 50 MCG tablet   Patient stated that she believes this medication is causing her hair to fall out and have balled spots. She would like a call back to see what she should do or if she should have her medication changed  Please advise

## 2018-08-31 NOTE — Telephone Encounter (Signed)
Hair loss is not from her thyroid medication.  Her thyroid level can be checked again to make sure she is getting enough but likely will need to see her dermatologist for the hair loss.  Also needs regular follow-up appointment made for 12/2018 with labs

## 2018-08-31 NOTE — Telephone Encounter (Signed)
Pt was scheduled for lb work on the 21'st

## 2018-09-01 ENCOUNTER — Other Ambulatory Visit: Payer: Self-pay | Admitting: Endocrinology

## 2018-09-01 DIAGNOSIS — E063 Autoimmune thyroiditis: Secondary | ICD-10-CM

## 2018-09-01 LAB — TSH: TSH: 2.41 mIU/L

## 2018-09-01 LAB — T3, FREE: T3, Free: 2.8 pg/mL (ref 2.3–4.2)

## 2018-09-01 LAB — T4, FREE: Free T4: 0.9 ng/dL (ref 0.8–1.8)

## 2018-09-02 NOTE — Progress Notes (Signed)
Please call to let patient know that the lab results are normal and she needs to have her PCP refer her to a dermatologist or go directly for hair loss.  Also needs to be seen in follow-up in 4 months with labs

## 2018-09-07 ENCOUNTER — Ambulatory Visit: Payer: Self-pay

## 2018-09-07 NOTE — Telephone Encounter (Signed)
Please advise 

## 2018-09-07 NOTE — Telephone Encounter (Signed)
Pt calling with increased anxiety for the past month. Pt having work stress and stress with daughter. Pt stated that she felt like this before and her medication was increased. 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 Pt has been feeling tense, anxious, worry, trouble sleeping and concentrating.  Pt stated that she is at the dermatologist because her hair is falling out and she has 3 bald spots on her head.  Offered to make pt an appointment but pt stated that that is part of her stress- getting off work to come in for appointment. Informed pt that a note will be sent to the provider. Informed pt that they may require an office visit. Routing note to [employer].  Reason for Disposition . Normal anxiety  Answer Assessment - Initial Assessment Questions 1. CONCERN: "What happened that made you call today?"     Increased anxiety 2. ANXIETY SYMPTOM SCREENING: "Can you describe how you have been feeling?"  (e.g., tense, restless, panicky, anxious, keyed up, trouble sleeping, trouble concentrating)     Tense,anxious, worrying, trouble sleeping and concentrating 3. ONSET: "How long have you been feeling this way?"     1 mo 4. RECURRENT: "Have you felt this way before?"  If yes: "What happened that time?" "What helped these feelings go away in the past?"      Yes-went up on medication 5. RISK OF HARM - SUICIDAL IDEATION:  "Do you ever have thoughts of hurting or killing yourself?"  (e.g., yes, no, no but preoccupation with thoughts about death)   - INTENT:  "Do you have thoughts of hurting or killing yourself right NOW?" (e.g., yes, no, N/A)   - PLAN: "Do you have a specific plan for how you would do this?" (e.g., gun, knife, overdose, no plan, N/A)     no 6. RISK OF HARM - HOMICIDAL IDEATION:  "Do you ever have thoughts of hurting or killing someone else?"  (e.g., yes, no, no but preoccupation with thoughts about death)   - INTENT:  "Do you have thoughts of hurting or killing someone right NOW?" (e.g., yes, no,  N/A)   - PLAN: "Do you have a specific plan for how you would do this?" (e.g., gun, knife, no plan, N/A)      no 7. FUNCTIONAL IMPAIRMENT: "How have things been going for you overall in your life? Have you had any more difficulties than usual doing your normal daily activities?"  (e.g., better, same, worse; self-care, school, work, interactions)     Working extra hours working harder family stress 8. SUPPORT: "Who is with you now?" "Who do you live with?" "Do you have family or friends nearby who you can talk to?"      Daughter- has family friends 9. THERAPIST: "Do you have a counselor or therapist? Name?"     Yes - unsure of the name- 10. STRESSORS: "Has there been any new stress or recent changes in your life?"       yes 11. CAFFEINE ABUSE: "Do you drink caffeinated beverages, and how much each day?" (e.g., coffee, tea, colas)       Coffee every morning 12. SUBSTANCE ABUSE: "Do you use any illegal drugs or alcohol?"       no 13. OTHER SYMPTOMS: "Do you have any other physical symptoms right now?" (e.g., chest pain, palpitations, difficulty breathing, fever)       Hair loss 3 bald spots to head 14. PREGNANCY: "Is there any chance you are pregnant?" "When was your last menstrual period?"  n/a  Protocols used: ANXIETY AND PANIC ATTACK-A-AH

## 2018-09-07 NOTE — Telephone Encounter (Signed)
She is currently on the max dose of her Lexapro so cannot increase further. Would have to add on something to this or switch the Lexapro but really would need to see her so we can discuss in detail to make sure we are starting the most appropriate treatment.

## 2018-09-07 NOTE — Telephone Encounter (Signed)
Spoke with patient and advised she is at max dose of Lexapro. We can add a medicine to current regimen or switch medication all together. She is agreeable and scheduled for appointment on 09/12/17.

## 2018-09-12 ENCOUNTER — Encounter: Payer: Self-pay | Admitting: Physician Assistant

## 2018-09-12 ENCOUNTER — Ambulatory Visit: Payer: No Typology Code available for payment source | Admitting: Psychology

## 2018-09-12 ENCOUNTER — Other Ambulatory Visit: Payer: Self-pay

## 2018-09-12 ENCOUNTER — Ambulatory Visit (INDEPENDENT_AMBULATORY_CARE_PROVIDER_SITE_OTHER): Payer: No Typology Code available for payment source | Admitting: Physician Assistant

## 2018-09-12 VITALS — BP 130/78 | HR 64 | Temp 98.3°F | Resp 16 | Ht 64.0 in | Wt 215.0 lb

## 2018-09-12 DIAGNOSIS — F329 Major depressive disorder, single episode, unspecified: Secondary | ICD-10-CM

## 2018-09-12 DIAGNOSIS — F419 Anxiety disorder, unspecified: Secondary | ICD-10-CM | POA: Diagnosis not present

## 2018-09-12 DIAGNOSIS — F4321 Adjustment disorder with depressed mood: Secondary | ICD-10-CM

## 2018-09-12 MED ORDER — BUSPIRONE HCL 7.5 MG PO TABS: 7.5000 mg | ORAL_TABLET | Freq: Two times a day (BID) | ORAL | 1 refills | Status: DC

## 2018-09-12 NOTE — Progress Notes (Signed)
Patient presents to clinic today c/o worsening anxiety x a couple of months. Is currently on a regimen of Lexapro 20 mg daily and Alprazolam 0.25 mg. Was previously well-controlled on the Lexapro with very rare use of the Xanax. Notes stressors worsening with change in job schedule (working more hours), family stressors. Notes anxiety and some anhedonia. Denies SI/HI. Denies panic attack. Is seeing her counselor (just started) and has appointment later today. Not sure if this has been helpful thus far. Has started exercising to help with stress.  Has noted hair loss, seeing Dermatology for this who feels is related to her stress levels. Thyroid is management by Endo, with recent normal thyroid panels on 09/01/18  Past Medical History:  Diagnosis Date  . Anxiety   . GERD (gastroesophageal reflux disease)     Current Outpatient Medications on File Prior to Visit  Medication Sig Dispense Refill  . Biotin 1610910000 MCG TABS Take 5 tablets by mouth daily.    . diclofenac sodium (VOLTAREN) 1 % GEL Apply 3 gm to 3 large joints up to 3 times a day.Dispense 3 tubes with 3 refills. 3 Tube 0  . escitalopram (LEXAPRO) 20 MG tablet Take 1 tablet (20 mg total) by mouth daily. 90 tablet 0  . levonorgestrel-ethinyl estradiol (AVIANE,ALESSE,LESSINA) 0.1-20 MG-MCG tablet Take 1 tablet by mouth daily.    Marland Kitchen. levothyroxine (SYNTHROID, LEVOTHROID) 50 MCG tablet Take 1 tablet (50 mcg total) by mouth daily. Take 1 tablet before breakfast 90 tablet 3  . liothyronine (CYTOMEL) 5 MCG tablet Take 1 tablet (5 mcg total) by mouth daily. Take 1 tablet before breakfast. 30 tablet 2  . loratadine (CLARITIN) 10 MG tablet Take 10 mg by mouth daily.    . Multiple Vitamin (MULTIVITAMIN) tablet Take 1 tablet by mouth daily.    Marland Kitchen. ALPRAZolam (XANAX) 0.25 MG tablet Take 1 tablet (0.25 mg total) by mouth 2 (two) times daily as needed for anxiety. (Patient not taking: Reported on 09/12/2018) 10 tablet 0   No current facility-administered  medications on file prior to visit.     Allergies  Allergen Reactions  . Wellbutrin [Bupropion]     Panic, Anxiety, Jitteriness    Family History  Problem Relation Age of Onset  . Hypertension Mother   . Heart disease Mother   . Heart disease Father   . Hypertension Father   . Diabetes Father   . Diabetes Sister   . Diabetes Sister   . Healthy Daughter   . Thyroid disease Neg Hx     Social History   Socioeconomic History  . Marital status: Single    Spouse name: Not on file  . Number of children: Not on file  . Years of education: Not on file  . Highest education level: Not on file  Occupational History  . Not on file  Social Needs  . Financial resource strain: Not on file  . Food insecurity:    Worry: Not on file    Inability: Not on file  . Transportation needs:    Medical: Not on file    Non-medical: Not on file  Tobacco Use  . Smoking status: Never Smoker  . Smokeless tobacco: Never Used  Substance and Sexual Activity  . Alcohol use: No  . Drug use: Never  . Sexual activity: Not Currently  Lifestyle  . Physical activity:    Days per week: Not on file    Minutes per session: Not on file  . Stress: Not on file  Relationships  . Social connections:    Talks on phone: Not on file    Gets together: Not on file    Attends religious service: Not on file    Active member of club or organization: Not on file    Attends meetings of clubs or organizations: Not on file    Relationship status: Not on file  Other Topics Concern  . Not on file  Social History Narrative  . Not on file   Review of Systems - See HPI.  All other ROS are negative.  BP 130/78   Pulse 64   Temp 98.3 F (36.8 C) (Oral)   Resp 16   Ht 5\' 4"  (1.626 m)   Wt 215 lb (97.5 kg)   SpO2 99%   BMI 36.90 kg/m   Physical Exam Vitals signs reviewed.  Constitutional:      Appearance: Normal appearance.  HENT:     Head: Normocephalic and atraumatic.     Right Ear: Tympanic membrane  normal.     Left Ear: Tympanic membrane normal.  Neck:     Musculoskeletal: Neck supple.  Neurological:     Mental Status: She is alert.     Recent Results (from the past 2160 hour(s))  T3, free     Status: None   Collection Time: 07/18/18  4:06 PM  Result Value Ref Range   T3, Free 3.3 2.3 - 4.2 pg/mL  T4, free     Status: None   Collection Time: 07/18/18  4:06 PM  Result Value Ref Range   Free T4 0.69 0.60 - 1.60 ng/dL    Comment: Specimens from patients who are undergoing biotin therapy and /or ingesting biotin supplements may contain high levels of biotin.  The higher biotin concentration in these specimens interferes with this Free T4 assay.  Specimens that contain high levels  of biotin may cause false high results for this Free T4 assay.  Please interpret results in light of the total clinical presentation of the patient.    TSH     Status: None   Collection Time: 07/18/18  4:06 PM  Result Value Ref Range   TSH 2.53 0.35 - 4.50 uIU/mL  TSH     Status: None   Collection Time: 09/01/18 10:47 AM  Result Value Ref Range   TSH 2.41 mIU/L    Comment:           Reference Range .           > or = 20 Years  0.40-4.50 .                Pregnancy Ranges           First trimester    0.26-2.66           Second trimester   0.55-2.73           Third trimester    0.43-2.91   T4, free     Status: None   Collection Time: 09/01/18 10:47 AM  Result Value Ref Range   Free T4 0.9 0.8 - 1.8 ng/dL  T3, free     Status: None   Collection Time: 09/01/18 10:47 AM  Result Value Ref Range   T3, Free 2.8 2.3 - 4.2 pg/mL    Assessment/Plan: 1. Anxiety and depression Will continue the Lexapro daily as directed. Start BuSpar 7.5 mg BID.  Continue counseling. Close follow-up scheduled.  - busPIRone (BUSPAR) 7.5 MG tablet; Take 1 tablet (7.5 mg  total) by mouth 2 (two) times daily.  Dispense: 60 tablet; Refill: 1   Piedad Climes, [place]

## 2018-09-12 NOTE — Patient Instructions (Signed)
Please continue the Lexapro taking as directed. Add on the BuSpar twice daily.  Continue counseling -- let me know if you are not connecting with your current therapist, and we can set up a new counselor.  Download the HeadSpace or Calm app to work on mindfulness meditation.   Follow-up with me in 4-5 weeks for reassessment.

## 2018-09-13 ENCOUNTER — Other Ambulatory Visit: Payer: Self-pay

## 2018-09-14 ENCOUNTER — Other Ambulatory Visit: Payer: Self-pay | Admitting: Endocrinology

## 2018-10-10 ENCOUNTER — Ambulatory Visit: Payer: No Typology Code available for payment source | Admitting: Psychology

## 2018-10-10 ENCOUNTER — Other Ambulatory Visit: Payer: Self-pay

## 2018-10-10 ENCOUNTER — Telehealth: Payer: Self-pay | Admitting: Endocrinology

## 2018-10-10 DIAGNOSIS — F4321 Adjustment disorder with depressed mood: Secondary | ICD-10-CM | POA: Diagnosis not present

## 2018-10-10 MED ORDER — LIOTHYRONINE SODIUM 5 MCG PO TABS: ORAL_TABLET | ORAL | 0 refills | Status: DC

## 2018-10-10 NOTE — Telephone Encounter (Signed)
rx sent

## 2018-10-10 NOTE — Telephone Encounter (Signed)
MEDICATION: liothyronine (CYTOMEL) 5 MCG tablet  PHARMACY:      [facility] [address], [place] - [phone] (Phone) [phone] (Fax)    IS THIS A 90 DAY SUPPLY : Yes  IS PATIENT OUT OF MEDICATION: Yes  IF NOT; HOW MUCH IS LEFT: 1  LAST APPOINTMENT DATE: @11 /25/19  NEXT APPOINTMENT DATE:@5 /06/2019  DO WE HAVE YOUR PERMISSION TO LEAVE A DETAILED MESSAGE: Yes  OTHER COMMENTS:    **Let patient know to contact pharmacy at the end of the day to make sure medication is ready. **  ** Please notify patient to allow 48-72 hours to process**  **Encourage patient to contact the pharmacy for refills or they can request refills through [facility]**

## 2018-11-09 ENCOUNTER — Other Ambulatory Visit: Payer: Self-pay | Admitting: Physician Assistant

## 2018-11-09 DIAGNOSIS — F419 Anxiety disorder, unspecified: Principal | ICD-10-CM

## 2018-11-09 DIAGNOSIS — F329 Major depressive disorder, single episode, unspecified: Secondary | ICD-10-CM

## 2018-11-09 DIAGNOSIS — F32A Depression, unspecified: Secondary | ICD-10-CM

## 2019-01-02 ENCOUNTER — Ambulatory Visit (INDEPENDENT_AMBULATORY_CARE_PROVIDER_SITE_OTHER): Payer: No Typology Code available for payment source | Admitting: Physician Assistant

## 2019-01-02 ENCOUNTER — Ambulatory Visit: Payer: No Typology Code available for payment source | Admitting: Endocrinology

## 2019-01-02 ENCOUNTER — Encounter: Payer: Self-pay | Admitting: Endocrinology

## 2019-01-02 ENCOUNTER — Other Ambulatory Visit: Payer: Self-pay

## 2019-01-02 ENCOUNTER — Encounter: Payer: Self-pay | Admitting: Physician Assistant

## 2019-01-02 VITALS — BP 122/72 | HR 73 | Ht 64.0 in | Wt 223.8 lb

## 2019-01-02 DIAGNOSIS — E063 Autoimmune thyroiditis: Secondary | ICD-10-CM | POA: Diagnosis not present

## 2019-01-02 DIAGNOSIS — N914 Secondary oligomenorrhea: Secondary | ICD-10-CM | POA: Diagnosis not present

## 2019-01-02 DIAGNOSIS — F329 Major depressive disorder, single episode, unspecified: Secondary | ICD-10-CM | POA: Diagnosis not present

## 2019-01-02 DIAGNOSIS — F419 Anxiety disorder, unspecified: Secondary | ICD-10-CM | POA: Diagnosis not present

## 2019-01-02 MED ORDER — MUPIROCIN 2 % EX OINT: 1.0000 "application " | TOPICAL_OINTMENT | Freq: Two times a day (BID) | CUTANEOUS | 0 refills | Status: DC

## 2019-01-02 MED ORDER — TRAZODONE HCL 50 MG PO TABS: 25.0000 mg | ORAL_TABLET | Freq: Every day | ORAL | 3 refills | Status: DC

## 2019-01-02 NOTE — Progress Notes (Signed)
Virtual Visit via Video   I connected with patient on 5/11 at 10:20 AM EDT by a video enabled telemedicine application and verified that I am speaking with the correct person using two identifiers.  Location patient: Home [facility]: [facility] [place] [place] Persons participating in the virtual visit: Patient, Provider, CMA (Patina Moore)  I discussed the limitations of evaluation and management by telemedicine and the availability of in person appointments. The patient expressed understanding and agreed to proceed.  Subjective:   HPI:   Patient presents via Doxy today for discussion of anxiety levels and mood. Patient last seen in 09/12/2018 for this when BuSpar 7.5 mg BID was added to her regimen of Lexapro 20 mg daily. She was instructed to follow-up in 4 weeks but has not been seen since that time. Notes taking the Lexapro as directed. Only taking the BuSpar once daily as she felt it was making her feel bad when she took later in the day. Cannot explain further. Has been dealing with increased anxiety levels over the past few months with panic attack at work. Notes sleep is not as sound as before noting some increased dreaming which she feels is due to stress. Has history of autoimmune hypothyroidism, managed by Endocrinology Lucianne Muss([facility]) and has appointment later today to recheck thyroid levels. 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 Notes the hair loss is having significant impact on mood, feeling more depressed and withdrawn. Denies SI/HI.  ROS:   See pertinent positives and negatives per HPI.  Patient Active Problem List   Diagnosis Date Noted  . Acquired autoimmune hypothyroidism 01/02/2019  . History of gastroesophageal reflux (GERD) 03/07/2018  . Elevated sed rate 01/31/2018  . Anxiety and depression 01/11/2018  . Chronic fatigue 01/11/2018    Social History   Tobacco Use  . Smoking status: Never Smoker  . Smokeless  tobacco: Never Used  Substance Use Topics  . Alcohol use: No    Current Outpatient Medications:  .  escitalopram (LEXAPRO) 20 MG tablet, Take 1 tablet by mouth once daily, Disp: 90 tablet, Rfl: 0 .  hydroxychloroquine (PLAQUENIL) 200 MG tablet, Take 200 mg by mouth 2 (two) times daily., Disp: , Rfl:  .  levothyroxine (SYNTHROID, LEVOTHROID) 50 MCG tablet, Take 1 tablet (50 mcg total) by mouth daily. Take 1 tablet before breakfast, Disp: 90 tablet, Rfl: 3 .  liothyronine (CYTOMEL) 5 MCG tablet, TAKE 1 TABLET BY MOUTH ONCE DAILY BEFORE BREAKFAST, Disp: 90 tablet, Rfl: 0 .  loratadine (CLARITIN) 10 MG tablet, Take 10 mg by mouth daily., Disp: , Rfl:  .  mupirocin ointment (BACTROBAN) 2 %, Place 1 application into the nose 2 (two) times daily. For 5 days, Disp: 22 g, Rfl: 0 .  traZODone (DESYREL) 50 MG tablet, Take 0.5 tablets (25 mg total) by mouth at bedtime., Disp: 30 tablet, Rfl: 3  Allergies  Allergen Reactions  . Wellbutrin [Bupropion]     Panic, Anxiety, Jitteriness    Objective:   There were no vitals taken for this visit.  Patient is well-developed, well-nourished in no acute distress.  Resting comfortably at home.  Head is normocephalic, atraumatic.  No labored breathing.  Speech is clear and coherent with logical content.  Patient is alert and oriented at baseline.   Assessment and Plan:   1. Anxiety and depression Deteriorating. Stop BuSpar. Continue Lexapro. Start Trazodone nightly to help with anxiety and sleep. FOllow-up with Endo to make sure thyroid is euthyroid and not  contributing to mood itself. Will have close follow-up to adjust medication. Recommend restarting counseling but she declines presently. Really feel her job has been the major trigger for her anxiety/depression/stress and unfortunately it may take a career change to resolve a lot of current issues.     Piedad Climes, PA-C 01/03/2019

## 2019-01-02 NOTE — Progress Notes (Signed)
I have discussed the procedure for the virtual visit with the patient who has given consent to proceed with assessment and treatment.   Samariah Hokenson S Shandra Szymborski, CMA     

## 2019-01-02 NOTE — Addendum Note (Signed)
Addended by: Adline Mango I on: 01/02/2019 03:51 PM   Modules accepted: Orders

## 2019-01-02 NOTE — Progress Notes (Signed)
Patient ID: Diana Lopez, female   DOB: 08-08-1976, 43 y.o.   MRN: [id number]           Referring Provider: Marcelline MatesWilliam Martin, PA  Reason for Appointment:  Hypothyroidism, follow-up visit    History of Present Illness:   Hypothyroidism was first diagnosed in 3/19  At the time of diagnosis patient was having symptoms of  fatigue which had been present for a few years She was not complaining of any cold sensitivity, dry skin but was having some hair loss .            TSH done from outside lab initially had shown her TSH level to be 5.8 in 3/19 However she was not started on thyroid supplementation until 5/19 when she was seen by her new PCP At that time baseline TSH was 4.7 from a different lab  The patient has been treated with  initially 50 mcg levothyroxine and then this was reduced down to 25 when her TSH went down to 0.2 However subsequently she was placed back on 50 mcg In 9/19 when she was tested on 50 mcg her TSH was up to 6.5 and she was then started on 75 mcg levothyroxine  With starting thyroid supplementation with levothyroxine the patient's symptoms did not improve and she continued to have significant fatigue and daytime somnolence  RECENT HISTORY: On her initial visit in 10/19 her levothyroxine was reduced to 50 mcg and Cytomel 5 mcg added empirically; baseline free T3 was 2.8 with normal TSH  With the combination of liothyronine and levothyroxine she felt significantly less fatigue and somnolence, was sleeping better at night also Also follow-up labs were normal In 08/2018 she was complaining about hair loss but her thyroid levels were okay  For the last couple of months she has been feeling more tired, tends to sleep more again and has decreased motivation She has gained about 8 pounds She tends to feel warm compared to before and occasional sweating  She is taking both thyroid supplements in the mornings with water before breakfast Currently not taking any biotin  Labs pending, last TSH was 2.1 in 1/20         Patient's weight history is as follows:  Wt Readings from Last 3 Encounters:  01/02/19 223 lb 12.8 oz (101.5 kg)  09/12/18 215 lb (97.5 kg)  07/18/18 222 lb 12.8 oz (101.1 kg)    Thyroid function results have been as follows:  Lab Results  Component Value Date   TSH 2.41 09/01/2018   TSH 2.53 07/18/2018   TSH 2.08 06/01/2018   TSH 6.49 (H) 05/11/2018   FREET4 0.9 09/01/2018   FREET4 0.69 07/18/2018   FREET4 1.1 06/01/2018   FREET4 0.8 04/19/2018   T3FREE 2.8 09/01/2018   T3FREE 3.3 07/18/2018   T3FREE 2.8 06/01/2018     Past Medical History:  Diagnosis Date  . Anxiety   . GERD (gastroesophageal reflux disease)     Past Surgical History:  Procedure Laterality Date  . CARPAL TUNNEL RELEASE Right 2017  . CESAREAN SECTION      Family History  Problem Relation Age of Onset  . Hypertension Mother   . Heart disease Mother   . Heart disease Father   . Hypertension Father   . Diabetes Father   . Diabetes Sister   . Diabetes Sister   . Healthy Daughter   . Thyroid disease Neg Hx     Social History:  reports that she has never smoked.  She has never used smokeless tobacco. She reports that she does not drink alcohol or use drugs.  Allergies:  Allergies  Allergen Reactions  . Wellbutrin [Bupropion]     Panic, Anxiety, Jitteriness    Allergies as of 01/02/2019      Reactions   Wellbutrin [bupropion]    Panic, Anxiety, Jitteriness      Medication List       Accurate as of Jan 02, 2019  2:40 PM. If you have any questions, ask your nurse or doctor.        STOP taking these medications   ALPRAZolam 0.25 MG tablet Commonly known as:  XANAX Stopped by:  Piedad Climes, PA-C   Biotin 16109 MCG Tabs Stopped by:  Reather Littler, MD   busPIRone 7.5 MG tablet Commonly known as:  BUSPAR Stopped by:  Piedad Climes, PA-C   diclofenac sodium 1 % Gel Commonly known as:  VOLTAREN Stopped by:  Reather Littler,  MD   levonorgestrel-ethinyl estradiol 0.1-20 MG-MCG tablet Commonly known as:  ALESSE Stopped by:  Reather Littler, MD   multivitamin tablet Stopped by:  Reather Littler, MD     TAKE these medications   escitalopram 20 MG tablet Commonly known as:  LEXAPRO Take 1 tablet by mouth once daily   hydroxychloroquine 200 MG tablet Commonly known as:  PLAQUENIL Take 200 mg by mouth 2 (two) times daily.   levothyroxine 50 MCG tablet Commonly known as:  SYNTHROID Take 1 tablet (50 mcg total) by mouth daily. Take 1 tablet before breakfast   liothyronine 5 MCG tablet Commonly known as:  CYTOMEL TAKE 1 TABLET BY MOUTH ONCE DAILY BEFORE BREAKFAST   loratadine 10 MG tablet Commonly known as:  CLARITIN Take 10 mg by mouth daily.   mupirocin ointment 2 % Commonly known as:  BACTROBAN Place 1 application into the nose 2 (two) times daily. For 5 days Started by:  Piedad Climes, PA-C   traZODone 50 MG tablet Commonly known as:  DESYREL Take 0.5 tablets (25 mg total) by mouth at bedtime. Started by:  Piedad Climes, PA-C          Review of Systems  She is on Lexapro long-term  She has been seen by the dermatologist and given prednisone for 6 weeks and now on hydroxychloroquine for alopecia in 2020.  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  She has been previously symptomatically better with trial of levothyroxine and Cytomel since 05/2018 Last TSH was  normal in 1/20  She is complaining about fatigue and weight gain and somnolence but this is likely to be from her depression rather than hypothyroidism  No significant thyroid enlargement  ALOPECIA: She appears to be having generalized alopecia and is being treated by dermatologist for reportedly autoimmune etiology with hydroxychloroquine currently     PLAN:   Check thyroid  levels including free T3 today  If her thyroid levels are normal will have her continue the same regimen of levothyroxine and liothyronine and have her follow-up with PCP for management of her depression  Check FSH since she has had a missed cycle and is complaining of some mild hot flashes and sweating  Need records of her visits with dermatologist    Reather Littler 01/02/2019, 2:40 PM      Note: This office note was prepared with Dragon voice recognition system technology. Any transcriptional errors that result from this process are unintentional.

## 2019-01-03 LAB — T4, FREE: Free T4: 0.8 ng/dL (ref 0.8–1.8)

## 2019-01-03 LAB — T3, FREE: T3, Free: 2.6 pg/mL (ref 2.3–4.2)

## 2019-01-03 LAB — FOLLICLE STIMULATING HORMONE: FSH: 16.9 m[IU]/mL

## 2019-01-03 LAB — TSH: TSH: 3.53 mIU/L

## 2019-01-04 ENCOUNTER — Other Ambulatory Visit: Payer: Self-pay

## 2019-01-04 MED ORDER — LEVOTHYROXINE SODIUM 75 MCG PO TABS: ORAL_TABLET | ORAL | 0 refills | Status: DC

## 2019-01-04 MED ORDER — LIOTHYRONINE SODIUM 5 MCG PO TABS: ORAL_TABLET | ORAL | 1 refills | Status: DC

## 2019-01-10 ENCOUNTER — Encounter: Payer: Self-pay | Admitting: Physician Assistant

## 2019-01-17 ENCOUNTER — Telehealth: Payer: Self-pay

## 2019-01-17 NOTE — Telephone Encounter (Signed)
Patient calls in today for a follow up from her Endocrinologist appt. She stated that her doctor lowered her LEVOTHYROXINE 75 mcg to 37.5 mcg and added on LIOTHYRONINE 10 mg. Patient stated that her endocrinologist told her to return to her PCP if she was not feeling better. She did not specify what was wrong other than her hair falling out. I asked her numerous times if the hair loss was getting worse, staying the same, or getting better since the change of thyroid medications. She could not give me a straight answer, just kept repeating that her hair is falling out. I informed patient that I would send a note to PCP for further recommendation.

## 2019-01-18 NOTE — Telephone Encounter (Signed)
Spoke with patient about her depression/anxiety. She states today she has been more irritable and aggravated. When she is sleeping she is dreaming about a lot of things. She is taking the Lexapro and Trazodone with no problems. She states she is feeling some improvements, not as fatigued.  Her appointment with Dermatology has been rescheduled due to the provider going out of town. Patient wanted a rx for a wig.   Patient is off next week.if you want patient to schedule an appointment.

## 2019-01-18 NOTE — Telephone Encounter (Signed)
I read through the specialists notes. Seems he felt that if there was no improvement in symptoms with change in her regimen he really feels fatigue is from the depression/anxiety itself which I do not disagree with. How is her anxiety/mood currently with the recent change in medication? Any scheduled follow-up with Dermatology who is managing her alopecia?

## 2019-01-19 ENCOUNTER — Encounter: Payer: Self-pay | Admitting: Emergency Medicine

## 2019-01-19 NOTE — Telephone Encounter (Signed)
My chart message sent to patient that we can schedule a follow up appt in 2 weeks and PCP will give a rx for a wig.

## 2019-01-19 NOTE — Telephone Encounter (Signed)
It will take some more time for the medication changes to get fully in her system.  Would recommend we continue current regimen for now. Follow-up in 2 weeks for reassessment and make further changes as needed.  I am happy to provide Rx for a wig. Does she have a place in mind she would like me to send Rx to or would she like to pick up?

## 2019-03-20 ENCOUNTER — Other Ambulatory Visit: Payer: Self-pay | Admitting: Physician Assistant

## 2019-03-20 DIAGNOSIS — F419 Anxiety disorder, unspecified: Secondary | ICD-10-CM

## 2019-03-20 DIAGNOSIS — F329 Major depressive disorder, single episode, unspecified: Secondary | ICD-10-CM

## 2019-03-21 MED ORDER — ESCITALOPRAM OXALATE 20 MG PO TABS: 20.0000 mg | ORAL_TABLET | Freq: Every day | ORAL | 0 refills | Status: DC

## 2019-07-06 ENCOUNTER — Ambulatory Visit: Payer: No Typology Code available for payment source | Admitting: Endocrinology

## 2019-07-13 DIAGNOSIS — L639 Alopecia areata, unspecified: Secondary | ICD-10-CM | POA: Insufficient documentation

## 2019-08-09 ENCOUNTER — Other Ambulatory Visit: Payer: Self-pay

## 2019-08-09 DIAGNOSIS — F329 Major depressive disorder, single episode, unspecified: Secondary | ICD-10-CM

## 2019-08-09 DIAGNOSIS — F419 Anxiety disorder, unspecified: Secondary | ICD-10-CM

## 2019-08-09 MED ORDER — ESCITALOPRAM OXALATE 20 MG PO TABS: 20.0000 mg | ORAL_TABLET | Freq: Every day | ORAL | 0 refills | Status: DC

## 2019-11-08 ENCOUNTER — Other Ambulatory Visit: Payer: Self-pay | Admitting: Physician Assistant

## 2019-11-08 DIAGNOSIS — F419 Anxiety disorder, unspecified: Secondary | ICD-10-CM

## 2019-11-08 DIAGNOSIS — F329 Major depressive disorder, single episode, unspecified: Secondary | ICD-10-CM

## 2019-11-27 ENCOUNTER — Other Ambulatory Visit: Payer: Self-pay

## 2019-11-27 ENCOUNTER — Encounter: Payer: Self-pay | Admitting: Physician Assistant

## 2019-11-27 ENCOUNTER — Telehealth (INDEPENDENT_AMBULATORY_CARE_PROVIDER_SITE_OTHER): Payer: No Typology Code available for payment source | Admitting: Physician Assistant

## 2019-11-27 DIAGNOSIS — F419 Anxiety disorder, unspecified: Secondary | ICD-10-CM

## 2019-11-27 DIAGNOSIS — F329 Major depressive disorder, single episode, unspecified: Secondary | ICD-10-CM

## 2019-11-27 DIAGNOSIS — F32A Depression, unspecified: Secondary | ICD-10-CM

## 2019-11-27 NOTE — Progress Notes (Signed)
   Virtual Visit via Video   I connected with patient on 11/27/19 at 10:00 AM EDT by a video enabled telemedicine application and verified that I am speaking with the correct person using two identifiers.  Location patient: Home Location provider: [place] [facility], Office Persons participating in the virtual visit: Patient, Provider, CMA (Patina Moore)  I discussed the limitations of evaluation and management by telemedicine and the availability of in person appointments. The patient expressed understanding and agreed to proceed.  Subjective:   HPI:   Patient presents via Caregility today to follow-up regarding anxiety. Patient is currently on a regimen of Lexapro 20 mg QD. Endorses taking medication daily as directed and tolerating well. Has noted great improvement in anxiety symptoms since being on this medication. Is now being followed by [facility] for her thyroid issues and alopecia with good hair regrowth. This has had a significant positive impact on her mood as expected. Notes some breakthrough anxiety on occasion. Is not very physically active currently and feels that has made a change since before she was walking 2 miles a day and feeling great.    ROS:   See pertinent positives and negatives per HPI.  Patient Active Problem List   Diagnosis Date Noted  . Acquired autoimmune hypothyroidism 01/02/2019  . History of gastroesophageal reflux (GERD) 03/07/2018  . Elevated sed rate 01/31/2018  . Anxiety and depression 01/11/2018  . Chronic fatigue 01/11/2018    Social History   Tobacco Use  . Smoking status: Never Smoker  . Smokeless tobacco: Never Used  Substance Use Topics  . Alcohol use: No    Current Outpatient Medications:  .  escitalopram (LEXAPRO) 20 MG tablet, TAKE 1 TABLET BY MOUTH ONCE DAILY . APPOINTMENT REQUIRED FOR FUTURE REFILLS, Disp: 15 tablet, Rfl: 0 .  thyroid (NATURE-THROID) 65 MG tablet, Nature-Throid 65 mg tablet  taking 1.5 tablets  daily, Disp: , Rfl:  .  loratadine (CLARITIN) 10 MG tablet, Take 10 mg by mouth daily., Disp: , Rfl:   Allergies  Allergen Reactions  . Wellbutrin [Bupropion]     Panic, Anxiety, Jitteriness    Objective:   There were no vitals taken for this visit.  Patient is well-developed, well-nourished in no acute distress.  Resting comfortably at home.  Head is normocephalic, atraumatic.  No labored breathing.  Speech is clear and coherent with logical content.  Patient is alert and oriented at baseline.    Assessment and Plan:   1. Anxiety and depression Doing great overall. Continue current regimen. Will work up to a goal of 150 minutes per week of aerobic activity. This will not only help weight and joints but also help with anxiety/mood. Follow-up if things are not continuing to do well. Will see for follow-up in 6 months. Sooner at her convenience for CPE.  Piedad Climes, PA-C 11/27/2019

## 2019-11-27 NOTE — Progress Notes (Signed)
I have discussed the procedure for the virtual visit with the patient who has given consent to proceed with assessment and treatment.   Shaneika Rossa S Vinnie Gombert, CMA     

## 2019-11-29 ENCOUNTER — Other Ambulatory Visit: Payer: Self-pay | Admitting: Physician Assistant

## 2019-11-29 DIAGNOSIS — F32A Depression, unspecified: Secondary | ICD-10-CM

## 2019-11-29 DIAGNOSIS — F329 Major depressive disorder, single episode, unspecified: Secondary | ICD-10-CM

## 2019-11-29 MED ORDER — ESCITALOPRAM OXALATE 20 MG PO TABS: ORAL_TABLET | ORAL | 1 refills | Status: DC

## 2020-05-27 ENCOUNTER — Other Ambulatory Visit: Payer: Self-pay | Admitting: Physician Assistant

## 2020-05-27 DIAGNOSIS — F32A Depression, unspecified: Secondary | ICD-10-CM

## 2020-06-26 ENCOUNTER — Other Ambulatory Visit: Payer: Self-pay | Admitting: Physician Assistant

## 2020-06-26 DIAGNOSIS — F32A Depression, unspecified: Secondary | ICD-10-CM

## 2020-06-26 DIAGNOSIS — F419 Anxiety disorder, unspecified: Secondary | ICD-10-CM

## 2020-06-28 ENCOUNTER — Other Ambulatory Visit: Payer: Self-pay | Admitting: Physician Assistant

## 2020-06-28 DIAGNOSIS — F419 Anxiety disorder, unspecified: Secondary | ICD-10-CM

## 2020-06-28 DIAGNOSIS — F32A Depression, unspecified: Secondary | ICD-10-CM

## 2020-07-02 ENCOUNTER — Other Ambulatory Visit: Payer: Self-pay | Admitting: Physician Assistant

## 2020-07-02 ENCOUNTER — Telehealth (INDEPENDENT_AMBULATORY_CARE_PROVIDER_SITE_OTHER): Payer: No Typology Code available for payment source | Admitting: Physician Assistant

## 2020-07-02 ENCOUNTER — Encounter: Payer: Self-pay | Admitting: Physician Assistant

## 2020-07-02 DIAGNOSIS — F419 Anxiety disorder, unspecified: Secondary | ICD-10-CM | POA: Diagnosis not present

## 2020-07-02 DIAGNOSIS — F32A Depression, unspecified: Secondary | ICD-10-CM

## 2020-07-02 NOTE — Progress Notes (Signed)
   Virtual Visit via Video   I connected with patient on 07/02/20 at  2:00 PM EST by a video enabled telemedicine application and verified that I am speaking with the correct person using two identifiers.  Location patient: Home Location provider: [place] [facility], Office Persons participating in the virtual visit: Patient, Provider, CMA (Patina Moore)  I discussed the limitations of evaluation and management by telemedicine and the availability of in person appointments. The patient expressed understanding and agreed to proceed.  Subjective:   HPI:   Patient presents via Caregility today to follow-up regarding anxiety and depression. Patient is currently on a regimen of Lexapro 20 mg daily. Endorses taking as directed and tolerating well. Denies breakthrough symptoms.   ROS:   See pertinent positives and negatives per HPI.  Patient Active Problem List   Diagnosis Date Noted  . Alopecia areata 07/13/2019  . Acquired autoimmune hypothyroidism 01/02/2019  . History of gastroesophageal reflux (GERD) 03/07/2018  . Elevated sed rate 01/31/2018  . Anxiety and depression 01/11/2018  . Chronic fatigue 01/11/2018    Social History   Tobacco Use  . Smoking status: Never Smoker  . Smokeless tobacco: Never Used  Substance Use Topics  . Alcohol use: No    Current Outpatient Medications:  .  escitalopram (LEXAPRO) 20 MG tablet, TAKE 1 TABLET BY MOUTH ONCE DAILY . APPOINTMENT REQUIRED FOR FUTURE REFILLS, Disp: 30 tablet, Rfl: 0 .  ferrous sulfate 325 (65 FE) MG tablet, Take 325 mg by mouth daily with breakfast., Disp: , Rfl:  .  loratadine (CLARITIN) 10 MG tablet, Take 10 mg by mouth daily., Disp: , Rfl:  .  NP THYROID 15 MG tablet, Take 15 mg by mouth daily., Disp: , Rfl:  .  NP THYROID 90 MG tablet, Take 90 mg by mouth daily., Disp: , Rfl:  .  Prasterone, DHEA, (DHEA 50) 50 MG CAPS, Take 1 capsule by mouth daily., Disp: , Rfl:  .  Turmeric 400 MG CAPS, Take 1 tablet by mouth  daily., Disp: , Rfl:  .  vitamin B-12 (CYANOCOBALAMIN) 1000 MCG tablet, Take 1,000 mcg by mouth daily., Disp: , Rfl:  .  Vitamin D-Vitamin K (VITAMIN K2-VITAMIN D3) 45-2000 MCG-UNIT CAPS, Take 1 capsule by mouth daily., Disp: , Rfl:  .  zinc gluconate 50 MG tablet, Take 50 mg by mouth daily., Disp: , Rfl:   Allergies  Allergen Reactions  . Wellbutrin [Bupropion]     Panic, Anxiety, Jitteriness    Objective:   There were no vitals taken for this visit.  Patient is well-developed, well-nourished in no acute distress.  Resting comfortably at home.  Head is normocephalic, atraumatic.  No labored breathing.  Speech is clear and coherent with logical content.  Patient is alert and oriented at baseline.   Assessment and Plan:   1. Anxiety and depression Stable. Continue current medication regimen. Follow-up 6 months. Will do CPE at that time.     Piedad Climes, PA-C 07/02/2020

## 2020-07-02 NOTE — Progress Notes (Signed)
I have discussed the procedure for the virtual visit with the patient who has given consent to proceed with assessment and treatment.   Quinten Allerton S Kasyn Stouffer, CMA     

## 2021-01-08 ENCOUNTER — Telehealth: Payer: Self-pay | Admitting: Physician Assistant

## 2021-01-08 ENCOUNTER — Other Ambulatory Visit: Payer: Self-pay

## 2021-01-08 DIAGNOSIS — F419 Anxiety disorder, unspecified: Secondary | ICD-10-CM

## 2021-01-08 DIAGNOSIS — F32A Depression, unspecified: Secondary | ICD-10-CM

## 2021-01-08 MED ORDER — ESCITALOPRAM OXALATE 20 MG PO TABS: ORAL_TABLET | ORAL | 0 refills | Status: AC

## 2021-01-08 NOTE — Telephone Encounter (Signed)
Pt called in asking for a refill on the lexapro, she states that she is out of this medication. She said that the pharmacy has sent use serial requests for the but I don't see where they have sent it electronically. Can we send this in?   Pt uses [facility] in [place].  She would also like a call to let her know when this has been done

## 2021-01-08 NOTE — Telephone Encounter (Signed)
Pt made aware of this//ELEA

## 2021-01-08 NOTE — Telephone Encounter (Signed)
Rx filled and sent to patient pharmacy 

## 2021-03-20 ENCOUNTER — Other Ambulatory Visit: Payer: Self-pay | Admitting: Family

## 2021-03-20 DIAGNOSIS — F419 Anxiety disorder, unspecified: Secondary | ICD-10-CM

## 2021-03-20 DIAGNOSIS — F32A Depression, unspecified: Secondary | ICD-10-CM

## 2021-09-11 ENCOUNTER — Other Ambulatory Visit: Payer: Self-pay | Admitting: Obstetrics

## 2021-09-11 DIAGNOSIS — R928 Other abnormal and inconclusive findings on diagnostic imaging of breast: Secondary | ICD-10-CM

## 2021-10-06 ENCOUNTER — Other Ambulatory Visit: Payer: Self-pay | Admitting: Obstetrics

## 2021-10-06 ENCOUNTER — Ambulatory Visit
Admission: RE | Admit: 2021-10-06 | Discharge: 2021-10-06 | Disposition: A | Discharge status: Home / Self Care | Payer: No Typology Code available for payment source | Source: Ambulatory Visit | Attending: Obstetrics | Admitting: Obstetrics

## 2021-10-06 DIAGNOSIS — R928 Other abnormal and inconclusive findings on diagnostic imaging of breast: Secondary | ICD-10-CM

## 2021-10-06 DIAGNOSIS — N6489 Other specified disorders of breast: Secondary | ICD-10-CM

## 2021-10-13 ENCOUNTER — Other Ambulatory Visit: Payer: No Typology Code available for payment source

## 2021-10-17 ENCOUNTER — Ambulatory Visit
Admission: RE | Admit: 2021-10-17 | Discharge: 2021-10-17 | Disposition: A | Discharge status: Home / Self Care | Payer: No Typology Code available for payment source | Source: Ambulatory Visit | Attending: Obstetrics | Admitting: Obstetrics

## 2021-10-17 DIAGNOSIS — N6489 Other specified disorders of breast: Secondary | ICD-10-CM

## 2022-04-06 ENCOUNTER — Other Ambulatory Visit: Payer: Self-pay | Admitting: Obstetrics

## 2022-04-06 DIAGNOSIS — N6489 Other specified disorders of breast: Secondary | ICD-10-CM

## 2022-05-19 ENCOUNTER — Other Ambulatory Visit: Payer: No Typology Code available for payment source

## 2022-06-23 ENCOUNTER — Ambulatory Visit
Admission: RE | Admit: 2022-06-23 | Discharge: 2022-06-23 | Disposition: A | Discharge status: Home / Self Care | Payer: No Typology Code available for payment source | Source: Ambulatory Visit | Attending: Obstetrics | Admitting: Obstetrics

## 2022-06-23 ENCOUNTER — Ambulatory Visit: Payer: No Typology Code available for payment source

## 2022-06-23 DIAGNOSIS — N6489 Other specified disorders of breast: Secondary | ICD-10-CM

## 2023-07-08 IMAGING — MG MM BREAST BX W LOC DEV 1ST LESION IMAGE BX SPEC STEREO GUIDE*L*
7 of 11 series · 7 of 31 positions shown · non-contrast
Comparison: Previous exams.
COMPARISON: Previous exams.
COMPARISON: Previous exams.

Addendum:
CLINICAL DATA: Asymmetry in both breasts.

EXAM:
RIGHT BREAST STEREOTACTIC CORE NEEDLE BIOPSY

[L (1 of 6)]
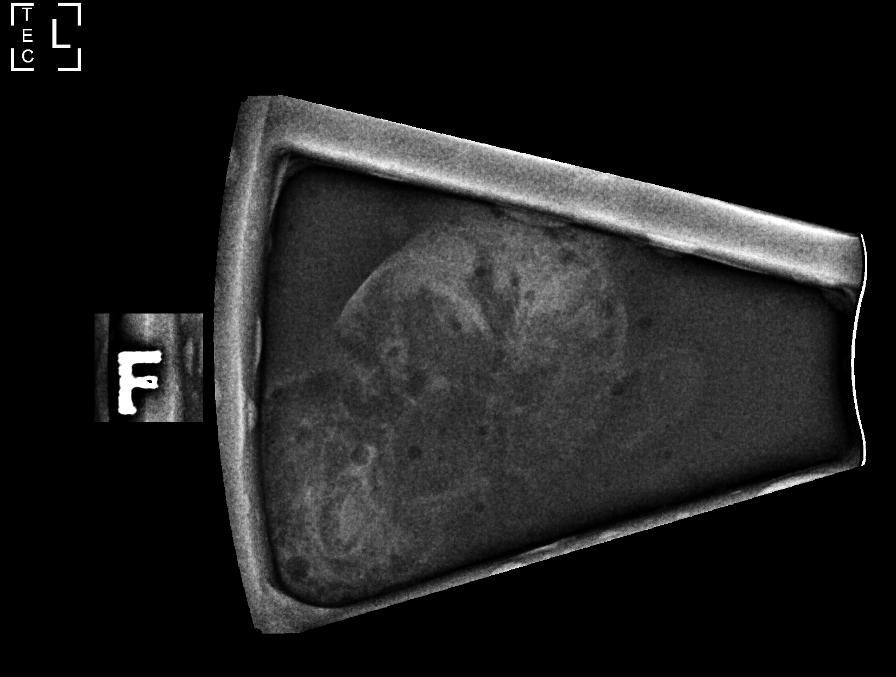

[L (2 of 6)]
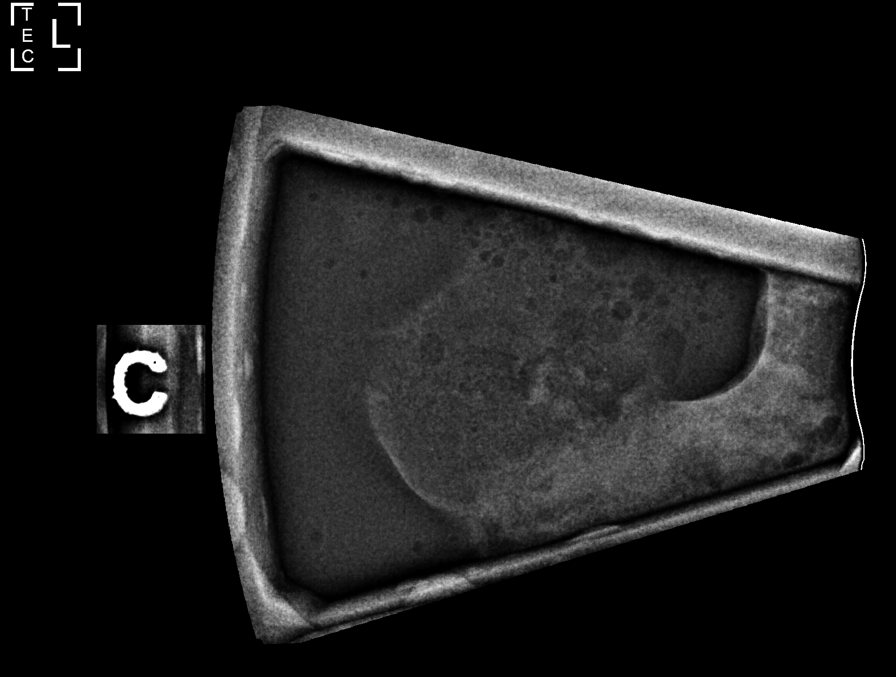

[L (3 of 6)]
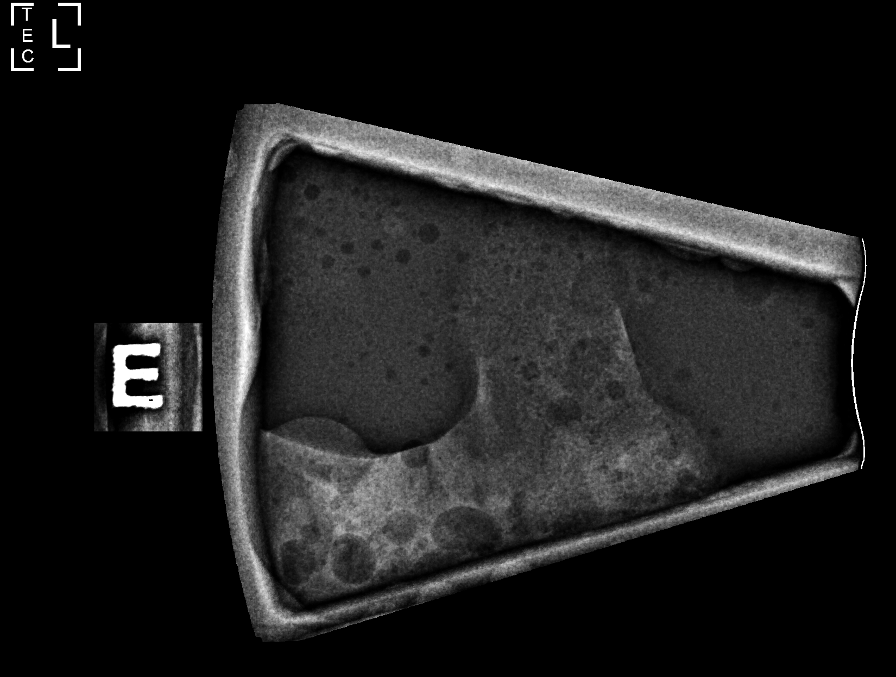

[L (4 of 6)]
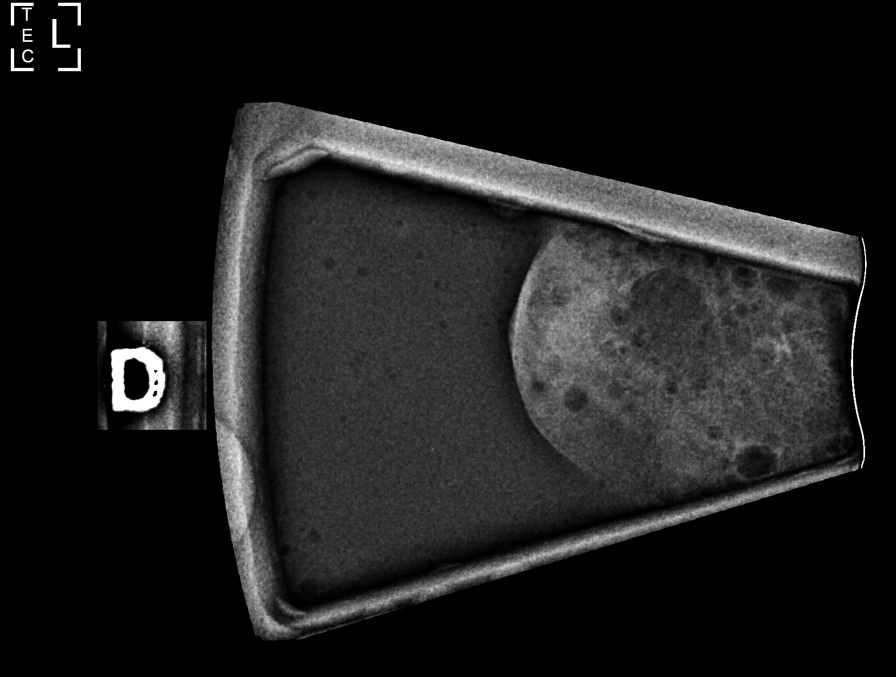

[L (5 of 6)]
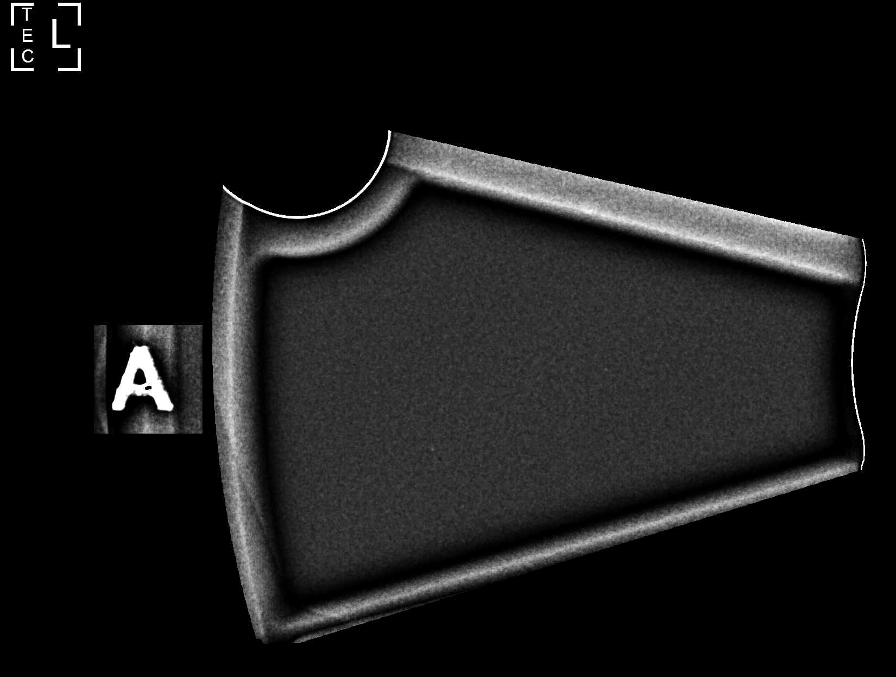

[L (6 of 6)]
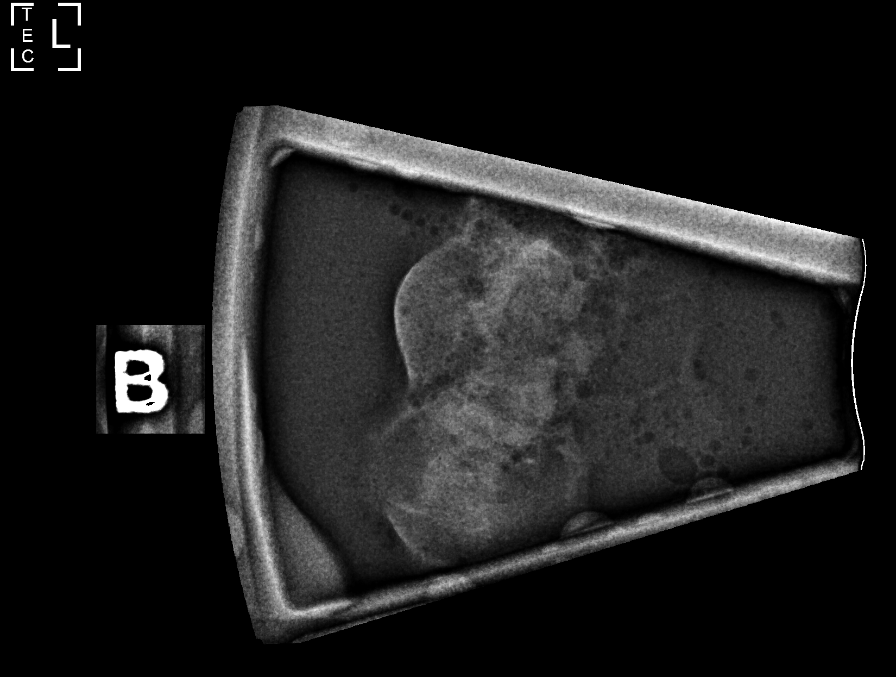

[L CC tomo · tomo slice 32/63.0]
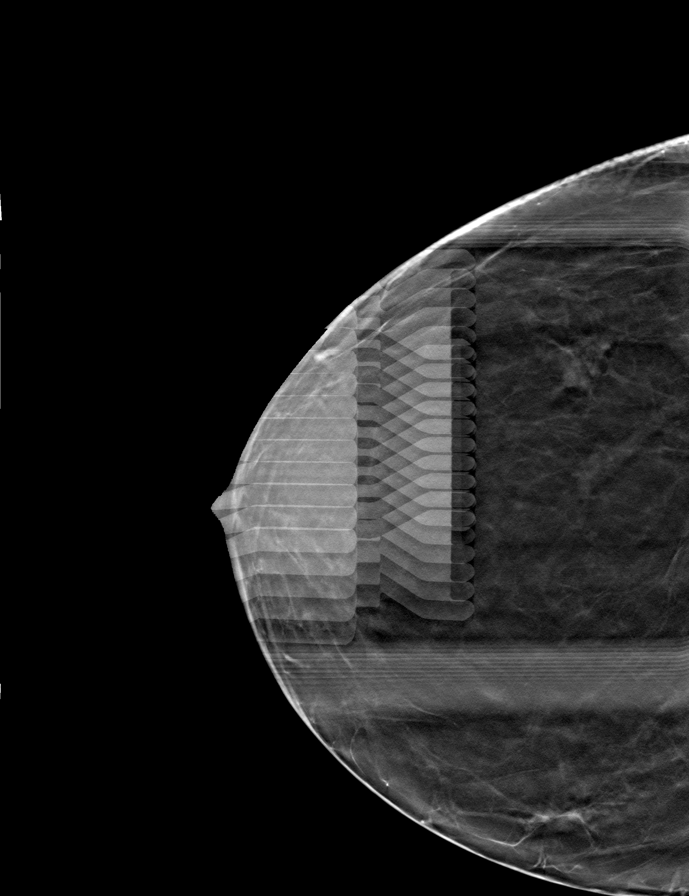

[7 of 31 positions shown; findings below may reference images not displayed]



Site 1: Lesion quadrant: UPPER central RIGHT breast, ribbon clip

Using sterile technique and 1% lidocaine as local anesthetic, under
stereotactic guidance, a 9 gauge vacuum assisted device was used to
perform core needle biopsy of asymmetry in the UPPER central RIGHT
breast using a craniocaudal approach.

At the conclusion of the procedure, ribbon tissue marker clip was
deployed into the biopsy cavity.

Site 2: Lesion quadrant: UPPER INNER QUADRANT LEFT breast, ribbon
clip

Using sterile technique and 1% lidocaine as local anesthetic, under
stereotactic guidance, a 9 gauge vacuum assisted device was used to
perform core needle biopsy of asymmetry in the UPPER INNER QUADRANT
of the LEFT breast using a craniocaudal approach.

At the conclusion of the procedure, ribbon shaped tissue marker clip
was deployed into the biopsy cavity.

Follow-up 2-view mammogram was performed and dictated separately.
IMPRESSION: Stereotactic-guided biopsy of asymmetry in both breasts. No apparent
complications.

ADDENDUM:
Corrected report:

EXAM:
RIGHT BREAST STEREOTACTIC CORE NEEDLE BIOPSY

LEFT BREAST STEREOTACTIC CORE NEEDLE BIOPSY

ADDENDUM:
Pathology revealed COLUMNAR CELL CHANGES AND PSEUDOANGIOMATOUS
STROMAL HYPERPLASIA- NO MALIGNANCY IDENTIFIED of the RIGHT breast,
upper central (ribbon clip). This was found to be concordant by Dr.
Wiwiex Faradila.

Pathology revealed PSEUDOANGIOMATOUS STROMAL HYPERPLASIA- NO
MALIGNANCY IDENTIFIED of the LEFT breast, upper inner quadrant
(ribbon clip). This was found to be concordant by Dr. Sonji
Cazeau.

Pathology results were discussed with the patient by telephone with
Nazareth Jumper RN Nurse Navigator. The patient reported doing well
after the biopsies with tenderness at the sites. Post biopsy
instructions and care were reviewed and questions were answered. The
patient was encouraged to call [REDACTED] for any additional concerns.

The patient was instructed to return for bilateral diagnostic
mammography and possible ultrasound in 6 months and informed a
reminder notice would be sent regarding this appointment.

Pathology results reported by Xian Lowry RN on 10/20/2021.

*** End of Addendum ***
Addendum:
FINDINGS: The patient and I discussed the procedure of stereotactic-guided
biopsy including benefits and alternatives. We discussed the high
likelihood of a successful procedure. We discussed the risks of the
procedure including infection, bleeding, tissue injury, clip
migration, and inadequate sampling. Informed written consent was
given. The usual time out protocol was performed immediately prior
to the procedure.

Site 1: Lesion quadrant: UPPER central RIGHT breast, ribbon clip

Using sterile technique and 1% lidocaine as local anesthetic, under
stereotactic guidance, a 9 gauge vacuum assisted device was used to
perform core needle biopsy of asymmetry in the UPPER central RIGHT
breast using a craniocaudal approach.

At the conclusion of the procedure, ribbon tissue marker clip was
deployed into the biopsy cavity.

Site 2: Lesion quadrant: UPPER INNER QUADRANT LEFT breast, ribbon
clip

Using sterile technique and 1% lidocaine as local anesthetic, under
stereotactic guidance, a 9 gauge vacuum assisted device was used to
perform core needle biopsy of asymmetry in the UPPER INNER QUADRANT
of the LEFT breast using a craniocaudal approach.

At the conclusion of the procedure, ribbon shaped tissue marker clip
was deployed into the biopsy cavity.

Follow-up 2-view mammogram was performed and dictated separately.
IMPRESSION: Stereotactic-guided biopsy of asymmetry in both breasts. No apparent
complications.

ADDENDUM:
Corrected report:

EXAM:
RIGHT BREAST STEREOTACTIC CORE NEEDLE BIOPSY

LEFT BREAST STEREOTACTIC CORE NEEDLE BIOPSY



Site 1: Lesion quadrant: UPPER central RIGHT breast, ribbon clip

Using sterile technique and 1% lidocaine as local anesthetic, under
stereotactic guidance, a 9 gauge vacuum assisted device was used to
perform core needle biopsy of asymmetry in the UPPER central RIGHT
breast using a craniocaudal approach.

At the conclusion of the procedure, ribbon tissue marker clip was
deployed into the biopsy cavity.

Site 2: Lesion quadrant: UPPER INNER QUADRANT LEFT breast, ribbon
clip

Using sterile technique and 1% lidocaine as local anesthetic, under
stereotactic guidance, a 9 gauge vacuum assisted device was used to
perform core needle biopsy of asymmetry in the UPPER INNER QUADRANT
of the LEFT breast using a craniocaudal approach.

At the conclusion of the procedure, ribbon shaped tissue marker clip
was deployed into the biopsy cavity.

Follow-up 2-view mammogram was performed and dictated separately.
IMPRESSION: Stereotactic-guided biopsy of asymmetry in both breasts. No apparent
complications.

## 2023-07-08 IMAGING — MG MM BREAST BX W/ LOC DEV 1ST LESION IMAGE BX SPEC STEREO GUIDE*R*
8 of 12 series · 8 of 24 positions shown · non-contrast
Comparison: Previous exams.
COMPARISON: Previous exams.

Addendum:
CLINICAL DATA: Asymmetry in both breasts.

EXAM:
RIGHT BREAST STEREOTACTIC CORE NEEDLE BIOPSY

[R (1 of 6)]
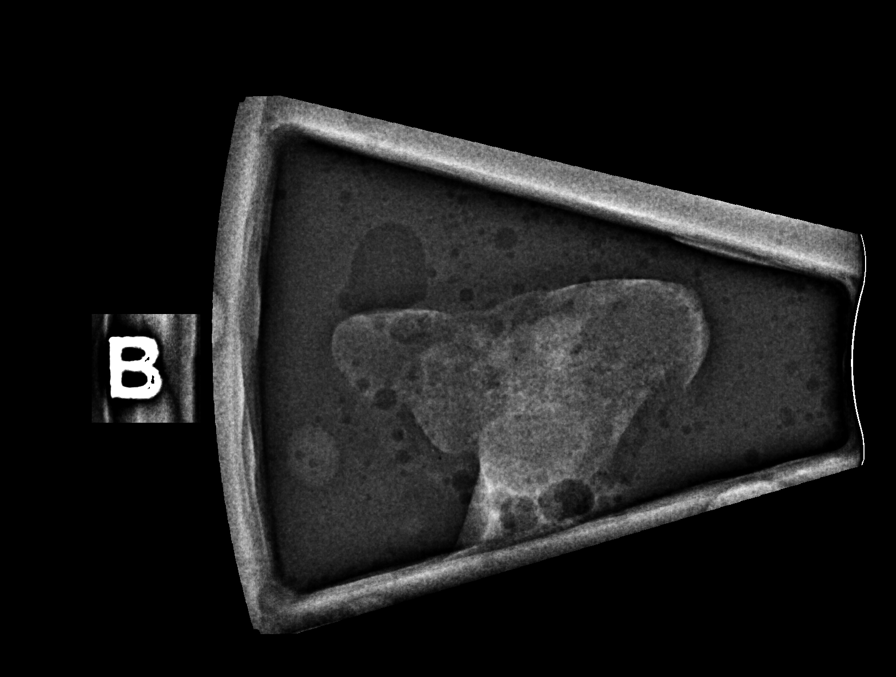

[R (2 of 6)]
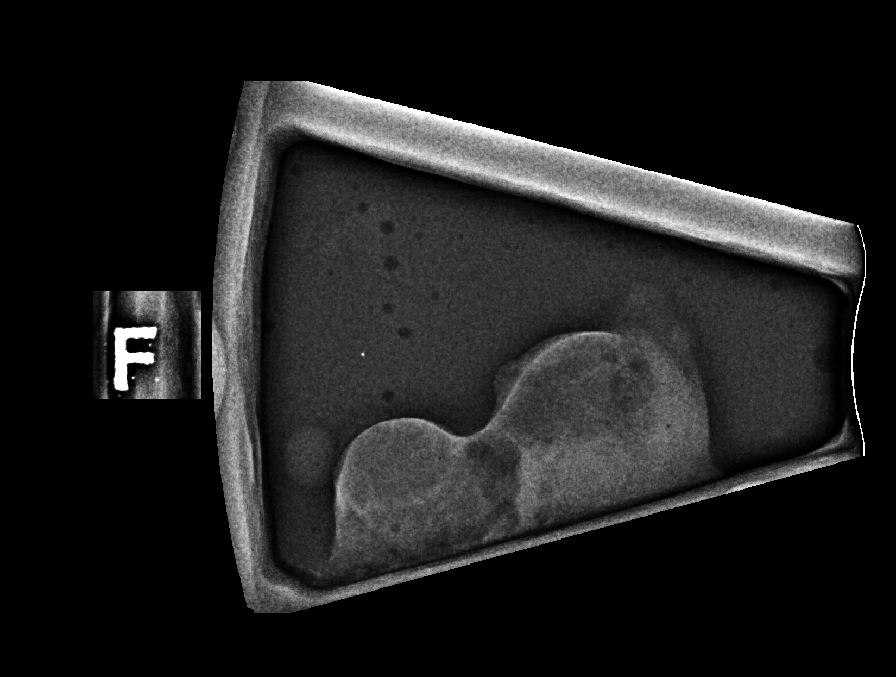

[R (3 of 6)]
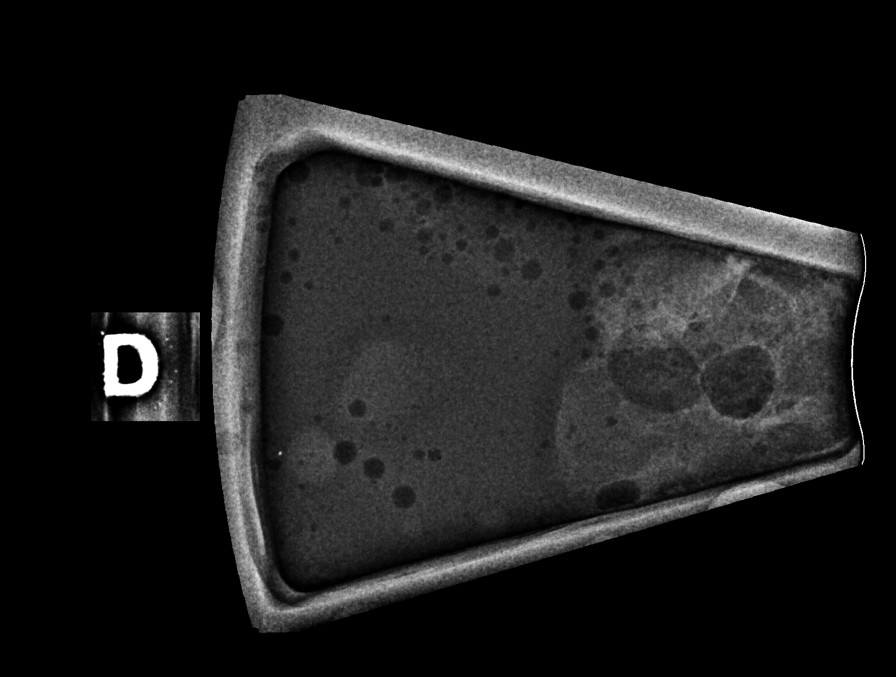

[R (4 of 6)]
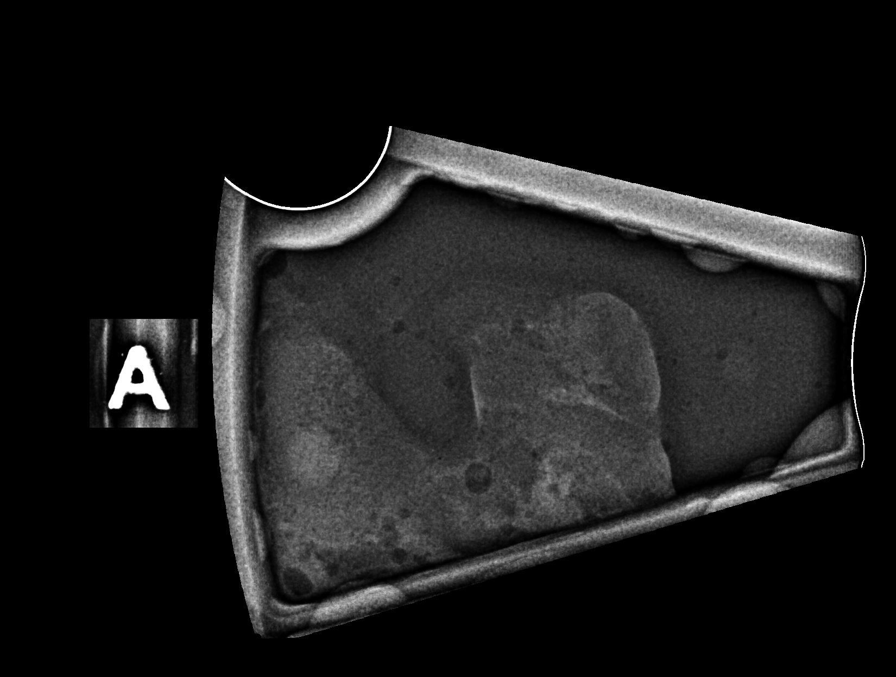

[R (5 of 6)]
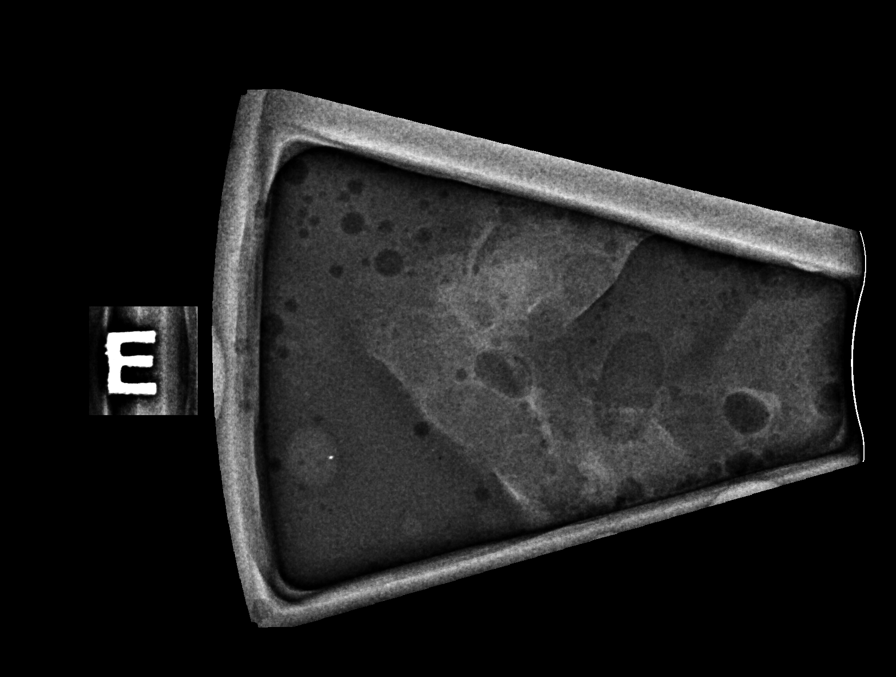

[R (6 of 6)]
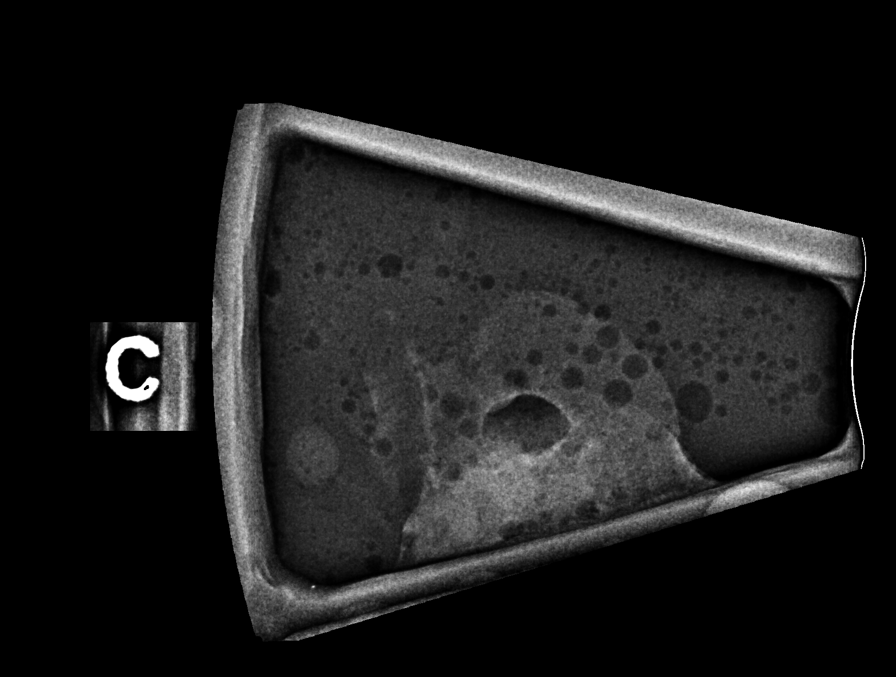

[R CC (1 of 2)]
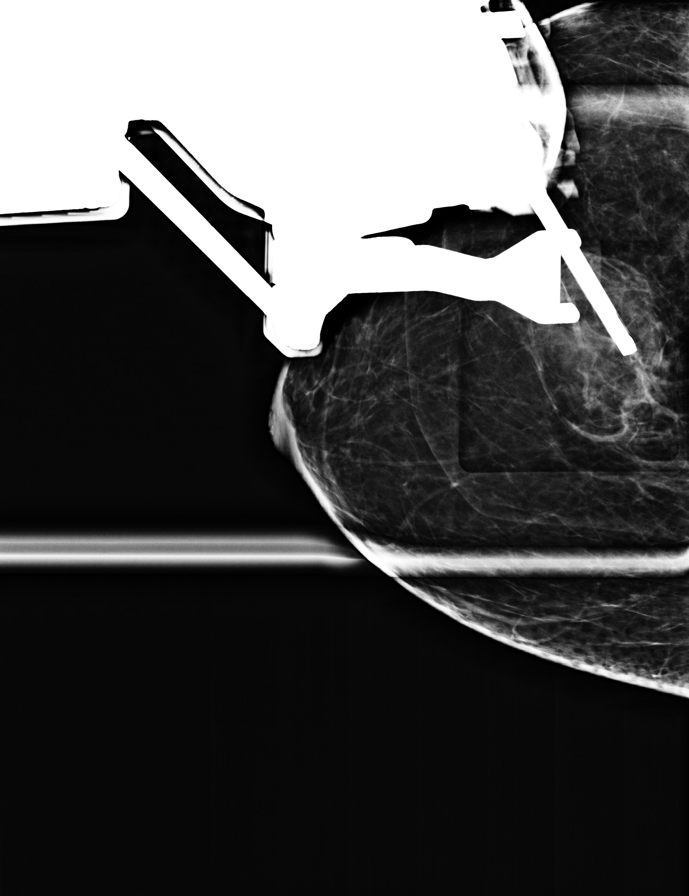

[R CC (2 of 2)]
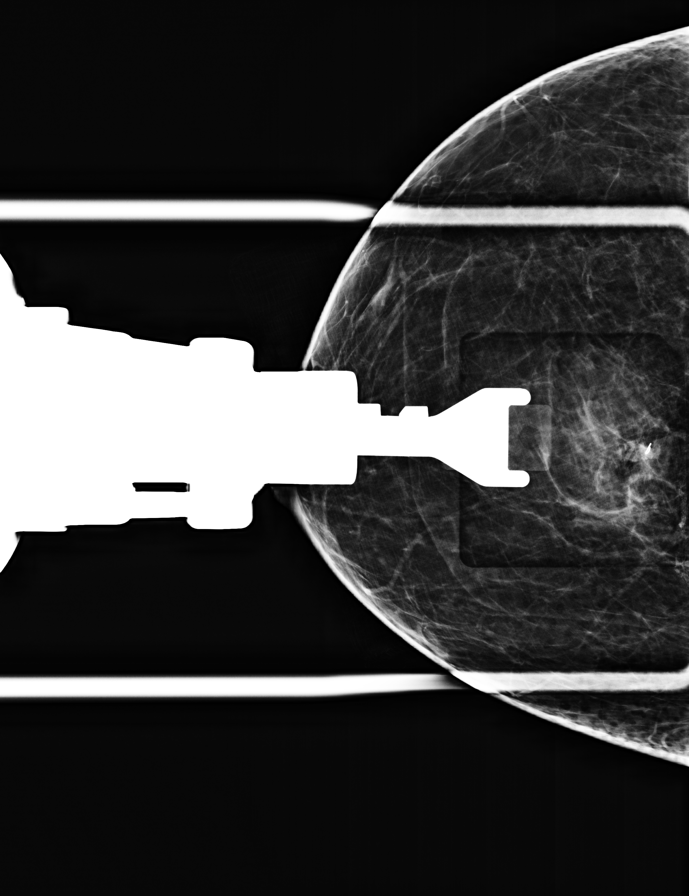

[8 of 24 positions shown; findings below may reference images not displayed]



Site 1: Lesion quadrant: UPPER central RIGHT breast, ribbon clip

Using sterile technique and 1% lidocaine as local anesthetic, under
stereotactic guidance, a 9 gauge vacuum assisted device was used to
perform core needle biopsy of asymmetry in the UPPER central RIGHT
breast using a craniocaudal approach.

At the conclusion of the procedure, ribbon tissue marker clip was
deployed into the biopsy cavity.

Site 2: Lesion quadrant: UPPER INNER QUADRANT LEFT breast, ribbon
clip

Using sterile technique and 1% lidocaine as local anesthetic, under
stereotactic guidance, a 9 gauge vacuum assisted device was used to
perform core needle biopsy of asymmetry in the UPPER INNER QUADRANT
of the LEFT breast using a craniocaudal approach.

At the conclusion of the procedure, ribbon shaped tissue marker clip
was deployed into the biopsy cavity.

Follow-up 2-view mammogram was performed and dictated separately.
IMPRESSION: Stereotactic-guided biopsy of asymmetry in both breasts. No apparent
complications.

ADDENDUM:
Corrected report:

EXAM:
RIGHT BREAST STEREOTACTIC CORE NEEDLE BIOPSY

LEFT BREAST STEREOTACTIC CORE NEEDLE BIOPSY



Site 1: Lesion quadrant: UPPER central RIGHT breast, ribbon clip

Using sterile technique and 1% lidocaine as local anesthetic, under
stereotactic guidance, a 9 gauge vacuum assisted device was used to
perform core needle biopsy of asymmetry in the UPPER central RIGHT
breast using a craniocaudal approach.

At the conclusion of the procedure, ribbon tissue marker clip was
deployed into the biopsy cavity.

Site 2: Lesion quadrant: UPPER INNER QUADRANT LEFT breast, ribbon
clip

Using sterile technique and 1% lidocaine as local anesthetic, under
stereotactic guidance, a 9 gauge vacuum assisted device was used to
perform core needle biopsy of asymmetry in the UPPER INNER QUADRANT
of the LEFT breast using a craniocaudal approach.

At the conclusion of the procedure, ribbon shaped tissue marker clip
was deployed into the biopsy cavity.

Follow-up 2-view mammogram was performed and dictated separately.
IMPRESSION: Stereotactic-guided biopsy of asymmetry in both breasts. No apparent
complications.
